# Patient Record
Sex: Male | Born: 1965 | Hispanic: No | Marital: Married | Smoking: Never smoker
Health system: Southern US, Community
[De-identification: ages and names within clinical notes are randomized; demographics above are authoritative.]

## PROBLEM LIST (undated history)

## (undated) DIAGNOSIS — K750 Abscess of liver: Secondary | ICD-10-CM

## (undated) DIAGNOSIS — I10 Essential (primary) hypertension: Secondary | ICD-10-CM

## (undated) DIAGNOSIS — K5790 Diverticulosis of intestine, part unspecified, without perforation or abscess without bleeding: Secondary | ICD-10-CM

## (undated) DIAGNOSIS — E785 Hyperlipidemia, unspecified: Secondary | ICD-10-CM

## (undated) HISTORY — DX: Essential (primary) hypertension: I10

## (undated) HISTORY — PX: ABSCESS DRAIN LIVER PERC (ARMC HX): HXRAD1235

## (undated) HISTORY — DX: Hyperlipidemia, unspecified: E78.5

## (undated) HISTORY — DX: Diverticulosis of intestine, part unspecified, without perforation or abscess without bleeding: K57.90

---

## 2008-11-05 ENCOUNTER — Ambulatory Visit: Payer: Self-pay | Admitting: Internal Medicine

## 2008-11-06 ENCOUNTER — Inpatient Hospital Stay (HOSPITAL_COMMUNITY): Admission: EM | Admit: 2008-11-06 | Discharge: 2008-11-15 | Payer: Self-pay | Admitting: Emergency Medicine

## 2008-11-06 ENCOUNTER — Ambulatory Visit: Payer: Self-pay | Admitting: Infectious Disease

## 2008-11-19 ENCOUNTER — Telehealth: Payer: Self-pay | Admitting: Infectious Disease

## 2008-11-19 ENCOUNTER — Ambulatory Visit: Payer: Self-pay | Admitting: Infectious Disease

## 2008-11-19 DIAGNOSIS — K750 Abscess of liver: Secondary | ICD-10-CM | POA: Insufficient documentation

## 2008-11-19 LAB — CONVERTED CEMR LAB
Eosinophils Absolute: 0.1 10*3/uL (ref 0.0–0.7)
Hemoglobin: 11.9 g/dL — ABNORMAL LOW (ref 13.0–17.0)
Lymphocytes Relative: 15 % (ref 12–46)
Lymphs Abs: 1.5 10*3/uL (ref 0.7–4.0)
MCHC: 31.1 g/dL (ref 30.0–36.0)
Monocytes Absolute: 0.8 10*3/uL (ref 0.1–1.0)
Neutro Abs: 7.7 10*3/uL (ref 1.7–7.7)
Platelets: 635 10*3/uL — ABNORMAL HIGH (ref 150–400)
WBC: 10.1 10*3/uL (ref 4.0–10.5)

## 2008-11-26 ENCOUNTER — Ambulatory Visit (HOSPITAL_COMMUNITY): Admission: RE | Admit: 2008-11-26 | Discharge: 2008-11-26 | Payer: Self-pay | Admitting: Internal Medicine

## 2008-11-26 ENCOUNTER — Telehealth (INDEPENDENT_AMBULATORY_CARE_PROVIDER_SITE_OTHER): Payer: Self-pay | Admitting: Licensed Clinical Social Worker

## 2008-12-06 ENCOUNTER — Inpatient Hospital Stay (HOSPITAL_COMMUNITY): Admission: AD | Admit: 2008-12-06 | Discharge: 2008-12-07 | Payer: Self-pay | Admitting: Internal Medicine

## 2008-12-06 ENCOUNTER — Ambulatory Visit: Payer: Self-pay | Admitting: Infectious Disease

## 2008-12-06 ENCOUNTER — Ambulatory Visit: Payer: Self-pay | Admitting: Surgery

## 2008-12-06 ENCOUNTER — Encounter (INDEPENDENT_AMBULATORY_CARE_PROVIDER_SITE_OTHER): Payer: Self-pay | Admitting: Internal Medicine

## 2008-12-06 ENCOUNTER — Ambulatory Visit: Payer: Self-pay | Admitting: Cardiovascular Disease

## 2008-12-06 DIAGNOSIS — R110 Nausea: Secondary | ICD-10-CM

## 2008-12-06 DIAGNOSIS — R091 Pleurisy: Secondary | ICD-10-CM | POA: Insufficient documentation

## 2008-12-06 DIAGNOSIS — R1013 Epigastric pain: Secondary | ICD-10-CM

## 2008-12-06 DIAGNOSIS — K3189 Other diseases of stomach and duodenum: Secondary | ICD-10-CM

## 2008-12-06 LAB — CONVERTED CEMR LAB
ALT: 19 units/L (ref 0–53)
Albumin: 3.7 g/dL (ref 3.5–5.2)
BUN: 13 mg/dL (ref 6–23)
Calcium: 8.9 mg/dL (ref 8.4–10.5)
Glucose, Bld: 90 mg/dL (ref 70–99)
Hemoglobin: 13.4 g/dL (ref 13.0–17.0)
MCV: 88 fL (ref 78.0–100.0)
Neutrophils Relative %: 65 % (ref 43–77)
Platelets: 283 10*3/uL (ref 150–400)
RDW: 15.2 % (ref 11.5–15.5)
Total Bilirubin: 0.5 mg/dL (ref 0.3–1.2)
Total Protein: 7 g/dL (ref 6.0–8.3)
WBC: 5.5 10*3/uL (ref 4.0–10.5)

## 2008-12-07 ENCOUNTER — Encounter (INDEPENDENT_AMBULATORY_CARE_PROVIDER_SITE_OTHER): Payer: Self-pay | Admitting: Internal Medicine

## 2008-12-07 ENCOUNTER — Encounter: Payer: Self-pay | Admitting: Cardiovascular Disease

## 2008-12-22 DIAGNOSIS — R079 Chest pain, unspecified: Secondary | ICD-10-CM

## 2008-12-26 ENCOUNTER — Ambulatory Visit: Payer: Self-pay

## 2008-12-26 ENCOUNTER — Ambulatory Visit: Payer: Self-pay | Admitting: Cardiology

## 2008-12-26 ENCOUNTER — Encounter: Payer: Self-pay | Admitting: Cardiovascular Disease

## 2009-01-18 ENCOUNTER — Encounter: Payer: Self-pay | Admitting: Infectious Disease

## 2009-01-31 ENCOUNTER — Telehealth: Payer: Self-pay | Admitting: Infectious Disease

## 2009-10-12 ENCOUNTER — Telehealth: Payer: Self-pay | Admitting: Internal Medicine

## 2009-10-13 ENCOUNTER — Inpatient Hospital Stay (HOSPITAL_COMMUNITY): Admission: EM | Admit: 2009-10-13 | Discharge: 2009-10-17 | Payer: Self-pay | Admitting: Emergency Medicine

## 2009-10-14 ENCOUNTER — Encounter (INDEPENDENT_AMBULATORY_CARE_PROVIDER_SITE_OTHER): Payer: Self-pay | Admitting: *Deleted

## 2009-10-15 ENCOUNTER — Ambulatory Visit: Payer: Self-pay | Admitting: Gastroenterology

## 2009-10-22 ENCOUNTER — Telehealth: Payer: Self-pay | Admitting: Gastroenterology

## 2009-11-04 ENCOUNTER — Ambulatory Visit: Payer: Self-pay | Admitting: Gastroenterology

## 2009-11-04 DIAGNOSIS — R933 Abnormal findings on diagnostic imaging of other parts of digestive tract: Secondary | ICD-10-CM | POA: Insufficient documentation

## 2009-11-04 DIAGNOSIS — E739 Lactose intolerance, unspecified: Secondary | ICD-10-CM

## 2009-11-07 ENCOUNTER — Ambulatory Visit: Payer: Self-pay | Admitting: Internal Medicine

## 2009-11-20 ENCOUNTER — Ambulatory Visit: Payer: Self-pay | Admitting: Gastroenterology

## 2009-11-25 ENCOUNTER — Encounter: Payer: Self-pay | Admitting: Gastroenterology

## 2010-06-23 ENCOUNTER — Encounter: Payer: Self-pay | Admitting: Internal Medicine

## 2010-06-23 ENCOUNTER — Encounter: Payer: Self-pay | Admitting: Infectious Disease

## 2010-07-01 NOTE — Procedures (Signed)
Summary: Colonoscopy  Patient: [name] [name] Note: All result statuses are Final unless otherwise noted.  Tests: (1) Colonoscopy (COL)   COL Colonoscopy           [facility]ONE      [facility]     [address]     [place], [place]  [address]           COLONOSCOPY PROCEDURE REPORT           PATIENT:  [name] [name], [name] [name]  MR#:  [id number]     BIRTHDATE:  Oct 04, 1965, 44 yrs. old  GENDER:  male     ENDOSCOPIST:  Judie Petit T. Russella Dar, MD, [facility]     Referred by:  Avel Peace, M.D.     PROCEDURE DATE:  11/20/2009     PROCEDURE:  Colonoscopy with hot biopsy and snare polypectomy     ASA CLASS:  Class I     INDICATIONS:  1) abnormal CT of abdomen     MEDICATIONS:   Fentanyl 125 mcg IV, Versed 12 mg IV     DESCRIPTION OF PROCEDURE:   After the risks benefits and     alternatives of the procedure were thoroughly explained, informed     consent was obtained.  Digital rectal exam was performed and     revealed no abnormalities.   The LB PCF-H180AL [id number] endoscope     was introduced through the anus and advanced to the terminal ileum     which was intubated for a short distance, without limitations.     The quality of the prep was excellent, using MoviPrep.  The     instrument was then slowly withdrawn as the colon was fully     examined.     <<PROCEDUREIMAGES>>     FINDINGS:  Two polyps were found in the descending colon. They     were 4 - 5 mm in size. Polyps were snared without cautery.     Retrieval was successful. Two polyps were found in the sigmoid     colon. They were 3 - 4 mm in size. With hot biopsy forceps, the     polps were cauterized, biopseis were obtained and sent to     pathology.  The terminal ileum appeared normal.  A normal     appearing cecum, ileocecal valve, and appendiceal orifice were     identified. The ascending, hepatic flexure, transverse, splenic     flexure, and rectum appeared unremarkable. Retroflexed views in     the rectum revealed no abnormalities. The  time to cecum = 3.75     minutes. The scope was then withdrawn (time = 16.5 min) from the     patient and the procedure completed.           COMPLICATIONS:  None           ENDOSCOPIC IMPRESSION:     1) 4 - 5 mm, Two polyps in the descending colon     2) 3 - 4 mm, Two polyps in the sigmoid colon     3) Normal terminal ileum           RECOMMENDATIONS:     1) No aspirin or NSAID's for 2 weeks     2) Await pathology results     3) If the polyps removed today are adenomatous (pre-cancerous),     you will need a repeat colonoscopy in 5 years. Otherwise you     should continue to follow colorectal cancer screening  guidelines     for "routine risk" patients with colonoscopy in 10 years.4) Follow     up with Dr. Abbey Chatters as planned           Venita Lick. Russella Dar, MD, [facility]           n.     eSIGNED:   Venita Lick. Daymeon Fischman at 11/20/2009 03:30 PM           Tonye Becket, [id number]  Note: An exclamation mark (!) indicates a result that was not dispersed into the flowsheet. Document Creation Date: 11/20/2009 3:30 PM _______________________________________________________________________  (1) Order result status: Final Collection or observation date-time: 11/20/2009 15:24 Requested date-time:  Receipt date-time:  Reported date-time:  Referring Physician:   Ordering Physician: Claudette Head [id number]) Specimen Source:  Source: Launa Grill Order Number: [id number] Lab site:   Appended Document: Colonoscopy     Procedures Next Due Date:    Colonoscopy: 10/2014

## 2010-07-01 NOTE — Progress Notes (Signed)
Summary: Schedule office visit  ---- Converted from flag ---- ---- 10/18/2009 11:50 AM, Meryl Dare MD [facility] wrote: Yes  ---- 10/18/2009 9:49 AM, Christie Nottingham CMA (AAMA) wrote: Can I double book you? You do not have any slots available.   ---- 10/17/2009 2:48 PM, Meryl Dare MD [facility] wrote: Needs office appt with me in 2-3 weeks for consider scheduling colonoscopy. Thx. MS ------------------------------  Phone Note Outgoing Call Call back at [facility] Phone [phone]   Call placed by: Christie Nottingham CMA Duncan Dull),  Oct 22, 2009 9:50 AM Call placed to: Patient Summary of Call: Called and left a message for pt on 10/18/09. Called and left another message for pt to call our office to schedule a office visit.  Initial call taken by: Christie Nottingham CMA Duncan Dull),  Oct 22, 2009 9:50 AM  Follow-up for Phone Call        Pt scheduled to see Dr. Russella Dar on 11/04/09 at 2:15pm. Pt's wife verbalized understanding of appt.  Follow-up by: Christie Nottingham CMA Duncan Dull),  Oct 24, 2009 9:07 AM

## 2010-07-01 NOTE — Progress Notes (Signed)
  Phone Note Call from Patient   Reason for Call: Acute Illness Complaint: Abdominal Pain Action Taken: Patient advised to go to ER Summary of Call: Got the call from his wife, stating that he has fever with right lower abdominal pain for one day. She requested for direct admission and I refused that and explained that he needs to go to ER before admission.  Initial call taken by: Jackson Latino MD,  Oct 12, 2009 10:18 PM

## 2010-07-01 NOTE — Assessment & Plan Note (Signed)
Summary: hosp f/u/ discuss colonoscopy/all   History of Present Illness Visit Type: follow up Primary GI MD: Elie Goody MD [facility] Requesting Provider: Avel Peace, MD Chief Complaint: Patient is here to discuss having a colonscopy. Patinet is post hospital and states that he is doing good and denies any GI complaints.  History of Present Illness:   This office visit follows a hospital consultation at [facility] for presumed small bowel diverticulitis presenting with right lower quadrant pain and an abnormal CT of the ileum. The symptoms have completely resolved after a course of antibiotics. His recent hospital discharge summary and recent imaging reports were reviewed. He has no gastrointestinal complaints. Surgical resection is planned by Dr. Abbey Chatters.   GI Review of Systems      Denies abdominal pain, acid reflux, belching, bloating, chest pain, dysphagia with liquids, dysphagia with solids, heartburn, loss of appetite, nausea, vomiting, vomiting blood, weight loss, and  weight gain.        Denies anal fissure, black tarry stools, change in bowel habit, constipation, diarrhea, diverticulosis, fecal incontinence, heme positive stool, hemorrhoids, irritable bowel syndrome, jaundice, light color stool, liver problems, rectal bleeding, and  rectal pain.   Current Medications (verified): 1)  None  Allergies (verified): 1)  ! * Latex  Past History:  Past Medical History: LACTOSE INTOLERANCE (ICD-271.3) DYSPEPSIA, MILD (ICD-536.8) PLEURISY (ICD-511.0) ABSCESS OF LIVER (ICD-572.0)  Past Surgical History: drainage of liver abcess 10/2009  Family History: Family history is noncontributory.  No FH of Colon Cancer:  Social History: married, employed. He is currently not working. His wife is a weekend relief nurse in the [employer].  He does walk on a regular basis.  There is no alcohol or cigarette intake.  Occupation: Consulting civil engineer  Review of Systems       The pertinent positives and  negatives are noted as above and in the HPI. All other ROS were reviewed and were negative.   Vital Signs:  Patient profile:   45 year old male Height:      68 inches Weight:      170.2 pounds BMI:     25.97 Pulse rate:   80 / minute Pulse rhythm:   regular BP sitting:   112 / 70  (right arm) Cuff size:   regular  Vitals Entered By: Harlow Mares CMA Duncan Dull) (November 04, 2009 3:16 PM)  Physical Exam  General:  Well developed, well nourished, no acute distress. Head:  Normocephalic and atraumatic. Eyes:  PERRLA, no icterus. Ears:  Normal auditory acuity. Mouth:  No deformity or lesions, dentition normal. Neck:  Supple; no masses or thyromegaly. Lungs:  Clear throughout to auscultation. Heart:  Regular rate and rhythm; no murmurs, rubs,  or bruits. Abdomen:  Soft, nontender and nondistended. No masses, hepatosplenomegaly or hernias noted. Normal bowel sounds. Rectal:  deferred until time of colonoscopy.   Msk:  Symmetrical with no gross deformities. Normal posture. Pulses:  Normal pulses noted. Extremities:  No clubbing, cyanosis, edema or deformities noted. Neurologic:  Alert and  oriented x4;  grossly normal neurologically. Cervical Nodes:  No significant cervical adenopathy. Axillary Nodes:  No significant axillary adenopathy. Psych:  Alert and cooperative. Normal mood and affect.  Impression & Recommendations:  Problem # 1:  NONSPECIFIC ABN FINDING RAD & OTH EXAM GI TRACT (ICD-793.4) Presumed small bowel diverticulitis. His symptoms have all resolved. Further evaluation with a CT enterography, and colonoscopy with attempted intubation of the terminal ileum. The risks, benefits and alternatives to colonoscopy with possible  biopsy and possible polypectomy were discussed with the patient and they consent to proceed. The procedure will be scheduled electively. Orders: Colonoscopy (Colon)  Other Orders: GI Misc Procedure/ Radiology Order (GI Misc )  Patient Instructions: 1)   Pick up your prep from your pharmacy.  2)  Colonoscopy brochure given.  3)  You have been scheduled for a CT scan. 4)  Copy sent to : Avel Peace, MD 5)  The medication list was reviewed and reconciled.  All changed / newly prescribed medications were explained.  A complete medication list was provided to the patient / caregiver.  Prescriptions: MOVIPREP 100 GM  SOLR (PEG-KCL-NACL-NASULF-NA ASC-C) As per prep instructions.  #1 x 0   Entered by:   Christie Nottingham CMA (AAMA)   Authorized by:   Meryl Dare MD [facility]   Signed by:   Christie Nottingham CMA (AAMA) on 11/04/2009   Method used:   Electronically to        [facility] (retail)       [address]       [place], [place]  [address]       Ph: [phone] or [phone]       Fax: [phone]   RxID:   [id number]

## 2010-07-01 NOTE — Letter (Signed)
Summary: [facility] Instructions  [facility]  [address] [place], [place] [address]   Phone: [phone]  Fax: [phone]       EFOSA TREICHLER    1965-07-30    MRN: [id number]        Procedure Day Dorna Bloom: Wednesday June 22nd, 2011     Arrival Time: 2:00pm     Procedure Time: 3:00pm     Location of Procedure:                    _x _  [facility] (4th Floor)                        PREPARATION FOR COLONOSCOPY WITH MOVIPREP   Starting 5 days prior to your procedure 11/15/09 do not eat nuts, seeds, popcorn, corn, beans, peas,  salads, or any raw vegetables.  Do not take any fiber supplements (e.g. Metamucil, Citrucel, and Benefiber).  THE DAY BEFORE YOUR PROCEDURE         DATE: 11/19/09  DAY: Tuesday  1.  Drink clear liquids the entire day-NO SOLID FOOD  2.  Do not drink anything colored red or purple.  Avoid juices with pulp.  No orange juice.  3.  Drink at least 64 oz. (8 glasses) of fluid/clear liquids during the day to prevent dehydration and help the prep work efficiently.  CLEAR LIQUIDS INCLUDE: Water Jello Ice Popsicles Tea (sugar ok, no milk/cream) Powdered fruit flavored drinks Coffee (sugar ok, no milk/cream) Gatorade Juice: apple, white grape, white cranberry  Lemonade Clear bullion, consomm, broth Carbonated beverages (any kind) Strained chicken noodle soup Hard Candy                             4.  In the morning, mix first dose of MoviPrep solution:    Empty 1 Pouch A and 1 Pouch B into the disposable container    Add lukewarm drinking water to the top line of the container. Mix to dissolve    Refrigerate (mixed solution should be used within 24 hrs)  5.  Begin drinking the prep at 5:00 p.m. The MoviPrep container is divided by 4 marks.   Every 15 minutes drink the solution down to the next mark (approximately 8 oz) until the full liter is complete.   6.  Follow completed prep with 16 oz of clear liquid of your choice  (Nothing red or purple).  Continue to drink clear liquids until bedtime.  7.  Before going to bed, mix second dose of MoviPrep solution:    Empty 1 Pouch A and 1 Pouch B into the disposable container    Add lukewarm drinking water to the top line of the container. Mix to dissolve    Refrigerate  THE DAY OF YOUR PROCEDURE      DATE: 11/20/09 DAY: Wednesday  Beginning at 10:00 a.m. (5 hours before procedure):         1. Every 15 minutes, drink the solution down to the next mark (approx 8 oz) until the full liter is complete.  2. Follow completed prep with 16 oz. of clear liquid of your choice.    3. You may drink clear liquids until 1:00pm (2 HOURS BEFORE PROCEDURE).   MEDICATION INSTRUCTIONS  Unless otherwise instructed, you should take regular prescription medications with a small sip of water   as early as possible the morning of your  procedure.        OTHER INSTRUCTIONS  You will need a responsible adult at least 48 years of age to accompany you and drive you home.   This person must remain in the waiting room during your procedure.  Wear loose fitting clothing that is easily removed.  Leave jewelry and other valuables at home.  However, you may wish to bring a book to read or  an iPod/MP3 player to listen to music as you wait for your procedure to start.  Remove all body piercing jewelry and leave at home.  Total time from sign-in until discharge is approximately 2-3 hours.  You should go home directly after your procedure and rest.  You can resume normal activities the  day after your procedure.  The day of your procedure you should not:   Drive   Make legal decisions   Operate machinery   Drink alcohol   Return to work  You will receive specific instructions about eating, activities and medications before you leave.    The above instructions have been reviewed and explained to me by   Marchelle Folks.     I fully understand and can verbalize these  instructions _____________________________ Date _________

## 2010-07-01 NOTE — Letter (Signed)
Summary: Patient Notice- Polyp Results  [facility]  [address] [place], [place] [address]   Phone: [phone]  Fax: [phone]        November 25, 2009 MRN: [id number]    [facility] [address] [place], [place]  [address]    Dear Mr. Soderlund,  I am pleased to inform you that the colon polyp(s) removed during your recent colonoscopy was (were) found to be benign (no cancer detected) upon pathologic examination.  I recommend you have a repeat colonoscopy examination in 5 years to look for recurrent polyps, as having colon polyps increases your risk for having recurrent polyps or even colon cancer in the future.  Should you develop new or worsening symptoms of abdominal pain, bowel habit changes or bleeding from the rectum or bowels, please schedule an evaluation with either your primary care physician or with me.  Continue treatment plan as outlined the day of your exam.  Please call us if you are having persistent problems or have questions about your condition that have not been fully answered at this time.  Sincerely,  Meryl Dare MD [facility]  This letter has been electronically signed by your physician.  Appended Document: Patient Notice- Polyp Results letter mailed 7.6.11

## 2010-08-18 LAB — URINALYSIS, ROUTINE W REFLEX MICROSCOPIC
Glucose, UA: NEGATIVE mg/dL
Hgb urine dipstick: NEGATIVE
Ketones, ur: NEGATIVE mg/dL
Nitrite: NEGATIVE
Protein, ur: NEGATIVE mg/dL
Specific Gravity, Urine: 1.013 (ref 1.005–1.030)
Urobilinogen, UA: 0.2 mg/dL (ref 0.0–1.0)
pH: 6.5 (ref 5.0–8.0)

## 2010-08-18 LAB — BASIC METABOLIC PANEL
BUN: 3 mg/dL — ABNORMAL LOW (ref 6–23)
CO2: 24 mEq/L (ref 19–32)
CO2: 27 mEq/L (ref 19–32)
Calcium: 8.2 mg/dL — ABNORMAL LOW (ref 8.4–10.5)
Chloride: 111 mEq/L (ref 96–112)
Creatinine, Ser: 0.89 mg/dL (ref 0.4–1.5)
Creatinine, Ser: 1.01 mg/dL (ref 0.4–1.5)
GFR calc Af Amer: >60 mL/min (ref >60)
Glucose, Bld: 89 mg/dL (ref 70–99)
Sodium: 143 mEq/L (ref 135–145)

## 2010-08-18 LAB — CBC
Hemoglobin: 13.7 g/dL (ref 13.0–17.0)
MCHC: 34.2 g/dL (ref 30.0–36.0)
MCHC: 34.7 g/dL (ref 30.0–36.0)
MCV: 91.1 fL (ref 78.0–100.0)
Platelets: 241 10*3/uL (ref 150–400)
RBC: 4.39 MIL/uL (ref 4.22–5.81)
RDW: 12.8 % (ref 11.5–15.5)
RDW: 12.9 % (ref 11.5–15.5)

## 2010-08-18 LAB — PHOSPHORUS: Phosphorus: 2.7 mg/dL (ref 2.3–4.6)

## 2010-08-18 LAB — MAGNESIUM: Magnesium: 2.2 mg/dL (ref 1.5–2.5)

## 2010-08-19 LAB — COMPREHENSIVE METABOLIC PANEL
ALT: 26 U/L (ref 0–53)
AST: 27 U/L (ref 0–37)
Alkaline Phosphatase: 39 U/L (ref 39–117)
CO2: 26 mEq/L (ref 19–32)
Glucose, Bld: 117 mg/dL — ABNORMAL HIGH (ref 70–99)
Potassium: 3.6 mEq/L (ref 3.5–5.1)
Sodium: 138 mEq/L (ref 135–145)
Total Protein: 6.5 g/dL (ref 6.0–8.3)

## 2010-08-19 LAB — DIFFERENTIAL
Basophils Relative: 0 % (ref 0–1)
Eosinophils Absolute: 0 10*3/uL (ref 0.0–0.7)
Eosinophils Relative: 0 % (ref 0–5)
Monocytes Absolute: 0.6 10*3/uL (ref 0.1–1.0)
Monocytes Relative: 4 % (ref 3–12)
Neutrophils Relative %: 87 % — ABNORMAL HIGH (ref 43–77)

## 2010-08-19 LAB — CBC
Hemoglobin: 15.4 g/dL (ref 13.0–17.0)
RBC: 4.95 MIL/uL (ref 4.22–5.81)
RDW: 12.5 % (ref 11.5–15.5)

## 2010-09-07 LAB — COMPREHENSIVE METABOLIC PANEL
Alkaline Phosphatase: 46 U/L (ref 39–117)
BUN: 13 mg/dL (ref 6–23)
CO2: 26 mEq/L (ref 19–32)
Chloride: 107 mEq/L (ref 96–112)
Creatinine, Ser: 1.07 mg/dL (ref 0.4–1.5)
GFR calc non Af Amer: >60 mL/min (ref >60)
Glucose, Bld: 119 mg/dL — ABNORMAL HIGH (ref 70–99)
Potassium: 4.1 mEq/L (ref 3.5–5.1)
Total Bilirubin: 0.7 mg/dL (ref 0.3–1.2)

## 2010-09-07 LAB — CBC
HCT: 39.8 % (ref 39.0–52.0)
Hemoglobin: 13.8 g/dL (ref 13.0–17.0)
MCV: 88.6 fL (ref 78.0–100.0)
RBC: 4.49 MIL/uL (ref 4.22–5.81)
WBC: 5.2 10*3/uL (ref 4.0–10.5)

## 2010-09-07 LAB — CARDIAC PANEL(CRET KIN+CKTOT+MB+TROPI)
Relative Index: INVALID (ref 0.0–2.5)
Troponin I: 0.01 ng/mL (ref 0.00–0.06)
Troponin I: 0.01 ng/mL (ref 0.00–0.06)

## 2010-09-08 LAB — BLOOD GAS, ARTERIAL
Acid-base deficit: 10.3 mmol/L — ABNORMAL HIGH (ref 0.0–2.0)
Acid-base deficit: 9.1 mmol/L — ABNORMAL HIGH (ref 0.0–2.0)
Bicarbonate: 12.4 mEq/L — ABNORMAL LOW (ref 20.0–24.0)
Bicarbonate: 14.6 mEq/L — ABNORMAL LOW (ref 20.0–24.0)
Bicarbonate: 15.6 mEq/L — ABNORMAL LOW (ref 20.0–24.0)
Bicarbonate: 17.4 mEq/L — ABNORMAL LOW (ref 20.0–24.0)
Drawn by: 317771
FIO2: 0.4 %
MECHVT: 500 mL
O2 Content: 4 L/min
O2 Saturation: 98.7 %
O2 Saturation: 99.4 %
O2 Saturation: 99.7 %
PEEP: 5 cmH2O
Patient temperature: 96.7
Patient temperature: 98.6
RATE: 28 resp/min
TCO2: 15.5 mmol/L (ref 0–100)
TCO2: 16.6 mmol/L (ref 0–100)
TCO2: 18 mmol/L (ref 0–100)
pCO2 arterial: 29 mmHg — ABNORMAL LOW (ref 35.0–45.0)
pH, Arterial: 7.322 — ABNORMAL LOW (ref 7.350–7.450)
pH, Arterial: 7.39 (ref 7.350–7.450)
pH, Arterial: 7.394 (ref 7.350–7.450)
pH, Arterial: 7.46 — ABNORMAL HIGH (ref 7.350–7.450)
pO2, Arterial: 124 mmHg — ABNORMAL HIGH (ref 80.0–100.0)
pO2, Arterial: 207 mmHg — ABNORMAL HIGH (ref 80.0–100.0)
pO2, Arterial: 222 mmHg — ABNORMAL HIGH (ref 80.0–100.0)

## 2010-09-08 LAB — HEMOGLOBIN A1C
Hgb A1c MFr Bld: 5.3 % (ref 4.6–6.1)
Mean Plasma Glucose: 105 mg/dL

## 2010-09-08 LAB — DIFFERENTIAL
Basophils Absolute: 0 10*3/uL (ref 0.0–0.1)
Basophils Absolute: 0 10*3/uL (ref 0.0–0.1)
Basophils Absolute: 0 10*3/uL (ref 0.0–0.1)
Basophils Absolute: 0 10*3/uL (ref 0.0–0.1)
Basophils Absolute: 0.1 10*3/uL (ref 0.0–0.1)
Basophils Relative: 0 % (ref 0–1)
Basophils Relative: 0 % (ref 0–1)
Basophils Relative: 0 % (ref 0–1)
Basophils Relative: 0 % (ref 0–1)
Basophils Relative: 0 % (ref 0–1)
Basophils Relative: 1 % (ref 0–1)
Eosinophils Absolute: 0.2 10*3/uL (ref 0.0–0.7)
Eosinophils Absolute: 0 10*3/uL (ref 0.0–0.7)
Eosinophils Absolute: 0 10*3/uL (ref 0.0–0.7)
Eosinophils Absolute: 0.1 10*3/uL (ref 0.0–0.7)
Eosinophils Absolute: 0.1 10*3/uL (ref 0.0–0.7)
Eosinophils Relative: 0 % (ref 0–5)
Eosinophils Relative: 0 % (ref 0–5)
Eosinophils Relative: 0 % (ref 0–5)
Lymphocytes Relative: 4 % — ABNORMAL LOW (ref 12–46)
Lymphocytes Relative: 7 % — ABNORMAL LOW (ref 12–46)
Lymphocytes Relative: 7 % — ABNORMAL LOW (ref 12–46)
Lymphs Abs: 0.6 10*3/uL — ABNORMAL LOW (ref 0.7–4.0)
Lymphs Abs: 0.9 10*3/uL (ref 0.7–4.0)
Lymphs Abs: 0.9 10*3/uL (ref 0.7–4.0)
Monocytes Absolute: 0.7 10*3/uL (ref 0.1–1.0)
Monocytes Absolute: 0.5 10*3/uL (ref 0.1–1.0)
Monocytes Absolute: 0.8 10*3/uL (ref 0.1–1.0)
Monocytes Relative: 6 % (ref 3–12)
Monocytes Relative: 3 % (ref 3–12)
Monocytes Relative: 4 % (ref 3–12)
Monocytes Relative: 6 % (ref 3–12)
Monocytes Relative: 8 % (ref 3–12)
Neutro Abs: 9.6 10*3/uL — ABNORMAL HIGH (ref 1.7–7.7)
Neutro Abs: 11 10*3/uL — ABNORMAL HIGH (ref 1.7–7.7)
Neutro Abs: 11.6 10*3/uL — ABNORMAL HIGH (ref 1.7–7.7)
Neutro Abs: 14.8 10*3/uL — ABNORMAL HIGH (ref 1.7–7.7)
Neutrophils Relative %: 81 % — ABNORMAL HIGH (ref 43–77)
Neutrophils Relative %: 83 % — ABNORMAL HIGH (ref 43–77)
Neutrophils Relative %: 86 % — ABNORMAL HIGH (ref 43–77)
Neutrophils Relative %: 87 % — ABNORMAL HIGH (ref 43–77)
Neutrophils Relative %: 88 % — ABNORMAL HIGH (ref 43–77)
Neutrophils Relative %: 91 % — ABNORMAL HIGH (ref 43–77)
Neutrophils Relative %: 93 % — ABNORMAL HIGH (ref 43–77)
WBC Morphology: INCREASED

## 2010-09-08 LAB — TYPE AND SCREEN
ABO/RH(D): O POS
Antibody Screen: NEGATIVE

## 2010-09-08 LAB — BASIC METABOLIC PANEL
BUN: 19 mg/dL (ref 6–23)
BUN: 5 mg/dL — ABNORMAL LOW (ref 6–23)
BUN: 7 mg/dL (ref 6–23)
CO2: 26 mEq/L (ref 19–32)
CO2: 20 mEq/L (ref 19–32)
Calcium: 8.3 mg/dL — ABNORMAL LOW (ref 8.4–10.5)
Calcium: 8.4 mg/dL (ref 8.4–10.5)
Calcium: 6.4 mg/dL — CL (ref 8.4–10.5)
Calcium: 7 mg/dL — ABNORMAL LOW (ref 8.4–10.5)
Calcium: 7.8 mg/dL — ABNORMAL LOW (ref 8.4–10.5)
Calcium: 7.8 mg/dL — ABNORMAL LOW (ref 8.4–10.5)
Calcium: 8.2 mg/dL — ABNORMAL LOW (ref 8.4–10.5)
Calcium: 8.3 mg/dL — ABNORMAL LOW (ref 8.4–10.5)
Chloride: 109 mEq/L (ref 96–112)
Chloride: 105 mEq/L (ref 96–112)
Creatinine, Ser: 0.94 mg/dL (ref 0.4–1.5)
Creatinine, Ser: 0.96 mg/dL (ref 0.4–1.5)
Creatinine, Ser: 0.88 mg/dL (ref 0.4–1.5)
Creatinine, Ser: 0.95 mg/dL (ref 0.4–1.5)
GFR calc Af Amer: >60 mL/min (ref >60)
GFR calc Af Amer: >60 mL/min (ref >60)
GFR calc Af Amer: >60 mL/min (ref >60)
GFR calc Af Amer: >60 mL/min (ref >60)
GFR calc non Af Amer: >60 mL/min (ref >60)
GFR calc non Af Amer: >60 mL/min (ref >60)
GFR calc non Af Amer: >60 mL/min (ref >60)
GFR calc non Af Amer: >60 mL/min (ref >60)
GFR calc non Af Amer: >60 mL/min (ref >60)
GFR calc non Af Amer: >60 mL/min (ref >60)
Glucose, Bld: 105 mg/dL — ABNORMAL HIGH (ref 70–99)
Glucose, Bld: 125 mg/dL — ABNORMAL HIGH (ref 70–99)
Glucose, Bld: 102 mg/dL — ABNORMAL HIGH (ref 70–99)
Glucose, Bld: 103 mg/dL — ABNORMAL HIGH (ref 70–99)
Glucose, Bld: 95 mg/dL (ref 70–99)
Potassium: 4.1 mEq/L (ref 3.5–5.1)
Potassium: 3.5 mEq/L (ref 3.5–5.1)
Potassium: 3.6 mEq/L (ref 3.5–5.1)
Potassium: 3.7 mEq/L (ref 3.5–5.1)
Potassium: 4.1 mEq/L (ref 3.5–5.1)
Sodium: 135 mEq/L (ref 135–145)
Sodium: 137 mEq/L (ref 135–145)
Sodium: 138 mEq/L (ref 135–145)
Sodium: 138 mEq/L (ref 135–145)
Sodium: 139 mEq/L (ref 135–145)

## 2010-09-08 LAB — APTT: aPTT: 42 seconds — ABNORMAL HIGH (ref 24–37)

## 2010-09-08 LAB — CBC
HCT: 28.1 % — ABNORMAL LOW (ref 39.0–52.0)
HCT: 31.1 % — ABNORMAL LOW (ref 39.0–52.0)
HCT: 33.7 % — ABNORMAL LOW (ref 39.0–52.0)
HCT: 33.8 % — ABNORMAL LOW (ref 39.0–52.0)
HCT: 34.5 % — ABNORMAL LOW (ref 39.0–52.0)
HCT: 35.6 % — ABNORMAL LOW (ref 39.0–52.0)
Hemoglobin: 11.7 g/dL — ABNORMAL LOW (ref 13.0–17.0)
Hemoglobin: 10.6 g/dL — ABNORMAL LOW (ref 13.0–17.0)
Hemoglobin: 10.9 g/dL — ABNORMAL LOW (ref 13.0–17.0)
Hemoglobin: 11.6 g/dL — ABNORMAL LOW (ref 13.0–17.0)
Hemoglobin: 12 g/dL — ABNORMAL LOW (ref 13.0–17.0)
Hemoglobin: 12.3 g/dL — ABNORMAL LOW (ref 13.0–17.0)
Hemoglobin: 12.8 g/dL — ABNORMAL LOW (ref 13.0–17.0)
MCHC: 33.7 g/dL (ref 30.0–36.0)
MCHC: 33.9 g/dL (ref 30.0–36.0)
MCHC: 34.1 g/dL (ref 30.0–36.0)
MCHC: 34.1 g/dL (ref 30.0–36.0)
MCHC: 34.7 g/dL (ref 30.0–36.0)
MCV: 88.8 fL (ref 78.0–100.0)
MCV: 87.1 fL (ref 78.0–100.0)
MCV: 88 fL (ref 78.0–100.0)
MCV: 88.2 fL (ref 78.0–100.0)
MCV: 88.3 fL (ref 78.0–100.0)
Platelets: 619 10*3/uL — ABNORMAL HIGH (ref 150–400)
Platelets: 389 10*3/uL (ref 150–400)
Platelets: 459 10*3/uL — ABNORMAL HIGH (ref 150–400)
Platelets: 469 10*3/uL — ABNORMAL HIGH (ref 150–400)
Platelets: 556 10*3/uL — ABNORMAL HIGH (ref 150–400)
RBC: 3.9 MIL/uL — ABNORMAL LOW (ref 4.22–5.81)
RBC: 3.12 MIL/uL — ABNORMAL LOW (ref 4.22–5.81)
RBC: 3.19 MIL/uL — ABNORMAL LOW (ref 4.22–5.81)
RBC: 3.68 MIL/uL — ABNORMAL LOW (ref 4.22–5.81)
RBC: 3.88 MIL/uL — ABNORMAL LOW (ref 4.22–5.81)
RBC: 3.92 MIL/uL — ABNORMAL LOW (ref 4.22–5.81)
RBC: 4.06 MIL/uL — ABNORMAL LOW (ref 4.22–5.81)
RBC: 4.32 MIL/uL (ref 4.22–5.81)
RDW: 13.8 % (ref 11.5–15.5)
RDW: 13.9 % (ref 11.5–15.5)
RDW: 12.8 % (ref 11.5–15.5)
RDW: 13.1 % (ref 11.5–15.5)
RDW: 13.2 % (ref 11.5–15.5)
RDW: 13.3 % (ref 11.5–15.5)
RDW: 13.4 % (ref 11.5–15.5)
RDW: 13.5 % (ref 11.5–15.5)
RDW: 13.6 % (ref 11.5–15.5)
WBC: 14 10*3/uL — ABNORMAL HIGH (ref 4.0–10.5)
WBC: 12.8 10*3/uL — ABNORMAL HIGH (ref 4.0–10.5)
WBC: 13.2 10*3/uL — ABNORMAL HIGH (ref 4.0–10.5)
WBC: 13.4 10*3/uL — ABNORMAL HIGH (ref 4.0–10.5)
WBC: 15.9 10*3/uL — ABNORMAL HIGH (ref 4.0–10.5)
WBC: 17.4 10*3/uL — ABNORMAL HIGH (ref 4.0–10.5)

## 2010-09-08 LAB — BODY FLUID CULTURE
Culture: NO GROWTH
Gram Stain: NONE SEEN

## 2010-09-08 LAB — GLUCOSE, CAPILLARY
Glucose-Capillary: 127 mg/dL — ABNORMAL HIGH (ref 70–99)
Glucose-Capillary: 133 mg/dL — ABNORMAL HIGH (ref 70–99)
Glucose-Capillary: 88 mg/dL (ref 70–99)
Glucose-Capillary: 98 mg/dL (ref 70–99)
Glucose-Capillary: 99 mg/dL (ref 70–99)

## 2010-09-08 LAB — CARBOXYHEMOGLOBIN
Carboxyhemoglobin: 0.6 % (ref 0.5–1.5)
Carboxyhemoglobin: 0.7 % (ref 0.5–1.5)
Methemoglobin: 0.3 % (ref 0.0–1.5)
Methemoglobin: 0.7 % (ref 0.0–1.5)
O2 Saturation: 69.9 %
O2 Saturation: 91.6 %

## 2010-09-08 LAB — PHOSPHORUS
Phosphorus: 1.2 mg/dL — ABNORMAL LOW (ref 2.3–4.6)
Phosphorus: 3.1 mg/dL (ref 2.3–4.6)

## 2010-09-08 LAB — CULTURE, BLOOD (ROUTINE X 2)
Culture: NO GROWTH
Culture: NO GROWTH
Culture: NO GROWTH
Culture: NO GROWTH

## 2010-09-08 LAB — LACTATE DEHYDROGENASE, PLEURAL OR PERITONEAL FLUID: LD, Fluid: 1173 U/L — ABNORMAL HIGH (ref 3–23)

## 2010-09-08 LAB — COMPREHENSIVE METABOLIC PANEL
ALT: 60 U/L — ABNORMAL HIGH (ref 0–53)
ALT: 64 U/L — ABNORMAL HIGH (ref 0–53)
AST: 34 U/L (ref 0–37)
AST: 56 U/L — ABNORMAL HIGH (ref 0–37)
Albumin: 2.4 g/dL — ABNORMAL LOW (ref 3.5–5.2)
Alkaline Phosphatase: 125 U/L — ABNORMAL HIGH (ref 39–117)
Alkaline Phosphatase: 127 U/L — ABNORMAL HIGH (ref 39–117)
BUN: 13 mg/dL (ref 6–23)
BUN: 7 mg/dL (ref 6–23)
CO2: 17 mEq/L — ABNORMAL LOW (ref 19–32)
CO2: 24 mEq/L (ref 19–32)
CO2: 24 mEq/L (ref 19–32)
Calcium: 6.5 mg/dL — ABNORMAL LOW (ref 8.4–10.5)
Calcium: 7.8 mg/dL — ABNORMAL LOW (ref 8.4–10.5)
Chloride: 105 mEq/L (ref 96–112)
Chloride: 111 mEq/L (ref 96–112)
Creatinine, Ser: 0.79 mg/dL (ref 0.4–1.5)
Creatinine, Ser: 0.87 mg/dL (ref 0.4–1.5)
GFR calc Af Amer: >60 mL/min (ref >60)
GFR calc Af Amer: >60 mL/min (ref >60)
GFR calc non Af Amer: >60 mL/min (ref >60)
GFR calc non Af Amer: >60 mL/min (ref >60)
Glucose, Bld: 121 mg/dL — ABNORMAL HIGH (ref 70–99)
Glucose, Bld: 91 mg/dL (ref 70–99)
Glucose, Bld: 97 mg/dL (ref 70–99)
Potassium: 3.8 mEq/L (ref 3.5–5.1)
Potassium: 4.1 mEq/L (ref 3.5–5.1)
Sodium: 138 mEq/L (ref 135–145)
Sodium: 140 mEq/L (ref 135–145)
Sodium: 141 mEq/L (ref 135–145)
Total Bilirubin: 0.7 mg/dL (ref 0.3–1.2)
Total Bilirubin: 1 mg/dL (ref 0.3–1.2)
Total Protein: 4.6 g/dL — ABNORMAL LOW (ref 6.0–8.3)
Total Protein: 6.3 g/dL (ref 6.0–8.3)
Total Protein: 6.8 g/dL (ref 6.0–8.3)

## 2010-09-08 LAB — CLOSTRIDIUM DIFFICILE EIA: C difficile Toxins A+B, EIA: NEGATIVE

## 2010-09-08 LAB — HIGH SENSITIVITY CRP: CRP, High Sensitivity: 266.8 mg/L — ABNORMAL HIGH

## 2010-09-08 LAB — CALCIUM, IONIZED: Calcium, Ion: 1.03 mmol/L — ABNORMAL LOW (ref 1.12–1.32)

## 2010-09-08 LAB — CARDIAC PANEL(CRET KIN+CKTOT+MB+TROPI)
CK, MB: 2 ng/mL (ref 0.3–4.0)
CK, MB: 3 ng/mL (ref 0.3–4.0)
Relative Index: 0.9 (ref 0.0–2.5)
Total CK: 174 U/L (ref 7–232)
Total CK: 320 U/L — ABNORMAL HIGH (ref 7–232)
Troponin I: 0.06 ng/mL (ref 0.00–0.06)

## 2010-09-08 LAB — CULTURE, ROUTINE-ABSCESS

## 2010-09-08 LAB — MAGNESIUM: Magnesium: 2.2 mg/dL (ref 1.5–2.5)

## 2010-09-08 LAB — URINALYSIS, ROUTINE W REFLEX MICROSCOPIC
Bilirubin Urine: NEGATIVE
Glucose, UA: NEGATIVE mg/dL
Ketones, ur: 15 mg/dL — AB
Nitrite: NEGATIVE
Protein, ur: NEGATIVE mg/dL
pH: 5.5 (ref 5.0–8.0)

## 2010-09-08 LAB — GIARDIA/CRYPTOSPORIDIUM SCREEN(EIA): Giardia Screen - EIA: NEGATIVE

## 2010-09-08 LAB — LACTIC ACID, PLASMA: Lactic Acid, Venous: 1.1 mmol/L (ref 0.5–2.2)

## 2010-09-08 LAB — GLUCOSE, SEROUS FLUID: Glucose, Fluid: 115 mg/dL

## 2010-09-08 LAB — LIPID PANEL: Triglycerides: 58 mg/dL (ref <150)

## 2010-09-08 LAB — TSH: TSH: 0.482 u[IU]/mL (ref 0.350–4.500)

## 2010-09-08 LAB — BRAIN NATRIURETIC PEPTIDE: Pro B Natriuretic peptide (BNP): 147 pg/mL — ABNORMAL HIGH (ref 0.0–100.0)

## 2010-09-08 LAB — RAPID STREP SCREEN (MED CTR MEBANE ONLY): Streptococcus, Group A Screen (Direct): NEGATIVE

## 2010-09-08 LAB — CORTISOL: Cortisol, Plasma: 27.2 ug/dL

## 2010-10-14 NOTE — Discharge Summary (Signed)
NAMECLOIS, TREANOR             ACCOUNT NO.:  [id number]   MEDICAL RECORD NO.:  [id number]          PATIENT TYPE:  INP   LOCATION:  4736                         FACILITY:  [facility]   PHYSICIAN:  Corinna L. Lendell Caprice, MDDATE OF BIRTH:  09-06-1965   DATE OF ADMISSION:  12/06/2008  DATE OF DISCHARGE:  12/07/2008                               DISCHARGE SUMMARY   DISCHARGE DIAGNOSES:  1. Chest pain, myocardial infarction, and pulmonary embolus ruled out.  2. Liver abscess.  3. Nausea secondary to medications.   DISCHARGE MEDICATIONS:  1. Phenergan 25 mg p.o. q.6 h. p.r.n. nausea.  2. Continue Cipro 500 mg p.o. b.i.d.  3. Flagyl 500 mg p.o. t.i.d.  4. Pepcid 20 mg p.o. b.i.d.   ACTIVITY:  Ad lib.   DIET:  As tolerated.   CONDITION:  Stable.   CONSULTATIONS:  Peter C. Eden Emms, MD, [facility]   PROCEDURES:  None.   FOLLOWUP:  Follow up with [facility] as they scheduled for  outpatient stress test.   LABORATORIES:  CBC, complete metabolic panel, cardiac enzymes, BNP all  within normal limits.  CT angiogram of the chest was suboptimal contrast  bolus, but without evidence for significant proximal pulmonary embolus,  mildly degraded by patient's motion, improved aeration in the right,  right pleural fluid tracks into the major fissure, otherwise clear.  EKG  showed normal sinus rhythm with LVH and Qs and inferior leads.  Doppler  of the legs showed no DVT.  Echocardiogram showed ejection fraction of  50-55%, normal wall thickness, and inferobasal hypokinesis.   HISTORY AND HOSPITAL COURSE:  Mr. Teuscher is a pleasant 45 year old  Filipino male, who was directly admitted from Dr. Clinton Gallant office with  concerns about the pulmonary embolus.  Apparently, he had had some  periodic shortness of breath, chest pressure, and ankle pain.  A D-dimer  was done in the office, which was 6, and the patient was directly  admitted to telemetry.  CAT scan and Dopplers negative for thrombus.  BNP  was normal.  The patient did describe some chest tightness for  several weeks, not necessarily related to exertion.  He did report some  initial orthopnea after discharge from the hospital during the last  hospitalization, but this has since resolved.  Cardiology was consulted.  Echocardiogram was done, and they have recommended outpatient workup.  The patient is stable at this time for discharge.      Corinna L. Lendell Caprice, MD  Electronically Signed     CLS/MEDQ  D:  12/07/2008  T:  12/08/2008  Job:  [id number]   cc:   Deatra James, M.D.  Acey Lav, MD

## 2010-10-14 NOTE — Discharge Summary (Signed)
NAMERONAL, [place] NO.:  [id number]   MEDICAL RECORD NO.:  [id number]          PATIENT TYPE:  INP   LOCATION:  5528                         FACILITY:  [facility]   PHYSICIAN:  Hollice Espy, M.D.DATE OF BIRTH:  February 13, 1966   DATE OF ADMISSION:  11/05/2008  DATE OF DISCHARGE:                               DISCHARGE SUMMARY   ATTENDING PHYSICIAN:  Hollice Espy, M.D.   PRIMARY CARE PHYSICIAN:  Deatra James, M.D. of [place].   HOSPITAL CONSULTATIONS:  Acey Lav, M.D. of infectious disease.   DISCHARGE DIAGNOSES:  1. Abscess of the liver status post drain placement.  2. SIRS (systemic inflammatory response syndrome) now resolved.  3. Diarrhea.  4. Respiratory failure on ventilator, now resolved.  5. Respiratory failure felt to be secondary to neurological process.  6. Delirium and agitation secondary to SIRS.   DISCHARGE MEDICATIONS:  1. Rocephin 1 gram daily IV through PICC line until the patient      follows up with infectious disease on June 28.  2. Flagyl 500 p.o. t.i.d. to be continued until the patient follows up      with infectious disease on June 28.  3. He previously was on some Cipro orally but this medication is being      discontinued.  He will not be discharged on any other medications.   DISPOSITION:  Improved.   ACTIVITY:  Slow to increase.  The patient will go home with home health  RN.   HOSPITAL COURSE:  The patient is a 45 year old Filipino male with no  past medical history who presented with 2-3 weeks of fevers, malaise and  chills.  He initially had some possible pyuria as an outpatient and was  started on ciprofloxacin and started having some explosive watery  diarrhea.  He was complaining of some abdominal pain and was evaluated  on right upper quadrant ultrasound which noted an abscess 7 x 6 x 4 x 7  cm in mass.  The patient was admitted on June 8 for and placed broad  spectrum antibiotics including imipenem, vanco and  Flagyl.  CT scan  showed a multiloculated liver abscess.  Initially there was concern  about C. difficile colitis and the patient was admitted.  Initially the  patient did well and eventually radiology placed a drain in the liver.  However, over the next couple of days the patient started to go  downhill, temperature went up to 105, heart rate went into the 180s and  respiratory rate 35 and he became severely agitated and went into acute  respiratory failure requiring intubation.  He was able to be extubated  same day and continued to have spiking fevers.  Infectious disease saw  the patient.  Antibiotics were followed.  Cultures were sent.  C.  difficile cultures were negative.  The patient continued to have good  output of liver abscess from his drainage.  Chest x-ray was done on June  11 noting bilateral lung subsegmental reactive edema.  The patient  tolerated the antibiotics well.  By June 12 the patient continued to  show slow signs of  improvement.  By June 16 the patient had very little  liver abscess drainage.  CT scan done on June 16 noted much improvement  but still persistent signs of some abscess although much smaller.  His  white count had come down to 11.9, however, on June 16 it increased to  14.  He was at this point relatively pain-free.  Interventional  radiology recommended a followup CT done in 2 weeks.  Infectious  disease, the rotating resident, evaluated the patient and planned for  the patient to have outpatient followup on June 28.  In the meantime IV  Rocephin has been set up for the patient to continue with home health  through a PICC line.  He will continue on p.o. Flagyl, both to be  continued until at least June 28 when the patient sees infectious  disease.  However, we are awaiting for Dr. Daiva Eves to sign off given  that the patient's white count is elevated although he has remained  afebrile.  He appears to be doing well.  He is relatively pain-free and   nausea-free.  The patient's activity will be as tolerated.  He will go  home on home health.  He is to follow up with his PCP, Dr. Wynelle Link, in about  2-4 weeks.      Hollice Espy, M.D.  Electronically Signed     SKK/MEDQ  D:  11/15/2008  T:  11/15/2008  Job:  [id number]

## 2010-10-14 NOTE — Consult Note (Signed)
NAMECARDIN, NITSCHKE NO.:  [id number]   MEDICAL RECORD NO.:  [id number]          PATIENT TYPE:  INP   LOCATION:  6526                         FACILITY:  [facility]   PHYSICIAN:  Acey Lav, MD  DATE OF BIRTH:  [birth year]   DATE OF CONSULTATION:  DATE OF DISCHARGE:                                 CONSULTATION   DISCUSSION:  For details, please see Dr. Ozella Almond written note.  Briefly, this is a 45 year old Filipino man who presents with between 2  and 3 weeks of fevers and malaise and chills.  He had additionally had  some vague shoulder pain which he attributed to him overusing his  shoulder.  He felt so unwell that he did not attend church over the last  couple of  Sundays.  He was seen by his primary care physician Friday at  which point in time a urinalysis was done which showed pyuria.  He was  started on ciprofloxacin.  In the interim over the weekend he continued  to have fevers and chills despite the ciprofloxacin and then began to  have explosive watery diarrhea.  He was evaluated with a right upper  quadrant ultrasound which disclosed an abscess on the 7th at 6.1 x 5.4 x  7.6 cm in mass.  He was admitted by [facility] on the 8th of this  month and placed on broad-spectrum antibiotics including imipenem,  vancomycin and Flagyl.  CT scan that was obtained had shown a  multiloculated liver abscess 6.4 x 5.4 x 5 cm.  We are asked to assist  in the management of this patient with hepatic abscess.  I examined the  patient with Dr. Gwenlyn Perking, and I agree with the findings in his note.   The patient's exam was pertinent for temperature maximum of 99.1, some  tenderness to palpation in the right upper quadrant without rebound.  Otherwise no significant abnormalities.   His laboratory data was significant for the CT scan which I have  described, for a peripheral leukocytosis on admission of 13,400,  absolute neutrophil count of 11,100.  The patient has gone  down now for  CT-guided placement of drain into his hepatic abscess which will be sent  for culture.   This patient has lived in the [place] for most of his recent life  and has not been back to the [place] since 2002.  He has only  traveled to [facility] in the interim.  He has never had a history  amoebic dysentery to his knowledge.  He lives in [place] with his  wife and son.  They do not have exotic pets.  They do not have  specifically any underlings that live with them or in proximity to them.  I think this patient most likely had a pyogenic liver abscess with  source likely being either his biliary system or potentially appendix or  somewhere else  in his gastrointestinal system.  I think amoebic liver  abscess is far less likely and Echinococcus is essentially out of the  picture given lack of epidemiological risks and lack of radiographic  appearance consistent with this.  We will simplify the patient to Zosyn  in the interim.  My resident was also concerned about the possibility of  C diff colitis as well given that he had explosive diarrhea that began  after he was started on ciprofloxacin.  I think it is reasonable to  check some C diff toxins, although my suspicion is not terribly high for  C diff at this point.   Thank you for this fascinating consultation.  We will follow along  closely.      Acey Lav, MD  Electronically Signed    CV/MEDQ  D:  11/06/2008  T:  11/06/2008  Job:  [id number]

## 2010-10-14 NOTE — Consult Note (Signed)
[name], [name]             ACCOUNT NO.:  [id number]   MEDICAL RECORD NO.:  [id number]          PATIENT TYPE:  INP   LOCATION:  4736                         FACILITY:  [facility]   PHYSICIAN:  Noralyn Pick. Eden Emms, MD, FACCDATE OF BIRTH:  09/05/1965   DATE OF CONSULTATION:  DATE OF DISCHARGE:  12/07/2008                                 CONSULTATION   A 45 year old patient we were asked to see for chest pain and shortness  of breath.   The patient was admitted to the hospital yesterday.  He had had a  prolonged hospitalization for liver abscess and sepsis syndrome with a  right pleural effusion.  After this prolonged hospitalization, he  complained to his primary care doctor about right calf pain.  There was  a concern for pulmonary embolus.   In talking to the patient about a week ago, he describes some atypical  chest pressure, it was nonexertional, it waxed and waned over the course  of entire day or two.  He had some exertional dyspnea with no pleuritic  component.  There was no sputum production or cough.  The patient  currently is asymptomatic with no shortness of breath or chest pressure.  He thought his right calf had a muscle strain or spasm, it actually  feels better today.   So far his workup has shown a negative venous duplex with no evidence of  DVT.  CT scan showed improved aeration of the lung at the right lung  base with no evidence of proximal pulmonary emboli.  Note should be made  that it was a poor contrast bolus and the distal pulmonary arteries were  not well seen.   The patient has not had a previous cardiac problem.  His enzymes are  negative here in the hospital, and he has no acute EKG changes with LVH  and by limb-lead criteria only.   His review of system is, otherwise, negative, in particular he has not  had a fever.  There has been no pleuritic component.  No hemoptysis.  No  sputum production.  No history of lower extremity swelling.  Just crampy  stiffness and pain in the right calf.  A 10-point review of system is,  otherwise, negative.   His past medical history is remarkable for:  1. Liver abscess with sepsis syndrome, recent hospitalization on November 06, 2008, with discharge on November 15, 2008.  2. History of diarrhea.  3. He was on a ventilator during his sepsis syndrome.   Family history is noncontributory.   The patient is originally [place] the [place]  He spends in the states  originally in [place] in 2001 and moved to [place] in 2006.  He  is currently not working.  His wife is a weekend relief nurse in the  [employer].  He has 1 daughter who is with him in the room today.   He primarily stays at home.  He does walk on a regular basis, but is  otherwise, not very active.  There is no alcohol or cigarette intake.   His current medications  in the hospital include:  1. Aspirin a day.  2. Cipro 500 mg q.8.  3. Lovenox.  4. Metronidazole 500 t.i.d.  5. Zofran.  6. Acetaminophen.   He has no known allergies.   PHYSICAL EXAMINATION:  GENERAL:  Remarkable for a [place] male in no  distress.  VITAL SIGNS:  He is afebrile, 97.4,;pulse is 60 and regular,  respirations 16, blood pressure 140/80, sats are 100% on room air.  HEENT:  Unremarkable.  NECK:  Carotids are normal without bruit.  No lymphadenopathy,  thyromegaly, JVP elevation.  LUNGS:  Decreased breath sounds at the right base but no crackles, no  pleuritic rub.  Diaphragmatic motion is good.  CARDIOVASCULAR:  S1 and S2, normal heart sounds.  PMI normal.  ABDOMEN:  Benign, bowel sounds positive.  No AAA.  No tenderness.  No  bruit.  No hepatosplenomegaly, no hepatojugular reflux or tenderness.  EXTREMITIES:  Distal pulses are intact.  No edema.  NEURO:  Nonfocal.  SKIN:  Warm and dry.  No muscle weakness.   EKG shows LVH.  CT scan was as described in HPI with no evidence of PE.   Lab work was remarkable for negative enzymes.  BNP less than 30.   Creatinine 1.07, potassium 4.1, and hematocrit of 39.8.   His echocardiogram was reviewed.  The inferior base was not well seen.  There may have been mild hypokinesis here; however, the overall EF was  50-55% with no definitive regional wall motion abnormality.  There was  no evidence of significant valvular heart disease.  No evidence of  pulmonary hypertension.   IMPRESSION:  1. Brief chest pressure and shortness of breath about a week ago.  The      patient admitted to the hospital primarily for right leg pain, rule      out pulmonary embolism.  His ultrasound and CT scan are negative      for pulmonary embolism.  He is currently asymptomatic.  I do not      think the patient needs further inpatient workup.  He will continue      his antibiotics in regards to his diarrhea and previous liver      abscess.  I am not quite sure why the patient would have had this      since he does not appear immunocompromised.  In regards to his      heart, I will see him in about 2 weeks and we can do an exercise      stress echo on him in regards to ruling out coronary artery      disease.  2. Liver abscess.  Continue antibiotics and outpatient followup with      ID.  3. Borderline hypertension.  We will see what his blood pressure runs      in the office, it appears to be mildly elevated here.  He would be      a reasonable candidate for low-dose ACE inhibitor.   I discussed these plans with the patient and his wife.  He is anxious  for discharge.  We will contact Dr. Lendell Caprice to help arrange this, and I  will see him in the office in 2 weeks for a stress echo.     Noralyn Pick. Eden Emms, MD, [facility]  Electronically Signed    PCN/MEDQ  D:  12/07/2008  T:  12/08/2008  Job:  [id number]

## 2010-10-14 NOTE — H&P (Signed)
[name] [name], [name] [name]             ACCOUNT NO.:  [id number]   MEDICAL RECORD NO.:  [id number]          PATIENT TYPE:  INP   LOCATION:  6526                         FACILITY:  [facility]   PHYSICIAN:  Michiel Cowboy, MDDATE OF BIRTH:  02-25-1966   DATE OF ADMISSION:  11/05/2008  DATE OF DISCHARGE:                              HISTORY & PHYSICAL   PRIMARY CARE PHYSICIAN:  Deatra James, M.D.   CHIEF COMPLAINT:  Fevers, chills, right upper quadrant pain.   The patient is a 45 year old gentleman with no significant past medical  history who presented with 3-week history of fevers, chills, anorexia  and right upper quadrant discomfort.  At first, he was seen by his  primary care Shaman Muscarella who empirically started him on Cipro but with no  improvement at which point he was sent for a right upper quadrant, given  right upper quadrant discomfort, which showed a 6.5-cm either mass or  abscess in his liver.  He was instructed to go to the emergency  department where he had a CT scan of his abdomen done that showed a 6-cm  complex intrahepatic abscess.  Appendix appears to be normal.  His  gallbladder did show some gallbladder polyps on ultrasound but otherwise  was apparently unremarkable.  At which point, the patient was arranged  for CT scan-guided abscess draining in the morning.  Surgical consult  was called with Dr. Lindie Spruce who felt the patient would be better served by  admitting to medicine.  At which point, [facility] was called.  The patient has been admitted for further evaluation.   REVIEW OF SYSTEMS:  Significant fevers, chills fro 3 weeks, right upper  quadrant pain, poor appetitie Otherwise unremarkable.   PAST MEDICAL HISTORY:  None.   ALLERGIES:  None.   MEDICATIONS:  The patient not taking any medication.  He was recently  started on Cipro.   SOCIAL HISTORY:  The patient does not smoke or drink.  Does not abuse  drugs.   PHYSICAL EXAMINATION:  VITALS:  Temperature  100.7, blood pressure  122/81, pulse 105, respirations 20, saturating 100% on room air.  The  patient appears to be in no acute distress.  HEENT:  Nontraumatic.  Moist mucous membranes.  LUNGS:  Clear to auscultation bilaterally.  HEART:  Regular rate and rhythm.  No murmurs, rubs or gallops noted.  LOWER EXTREMITIES:  Without clubbing, cyanosis or edema.  ABDOMEN:  Very slight right upper quadrant tenderness/discomfort but  otherwise unremarkable.  No rebound.  No guarding.  NEUROLOGIC:  The patient intact.  SKIN:  Clean, dry and intact.   LABORATORY DATA:  White blood cell count 13.4.  Sodium 138, potassium  4.1, creatinine 1.04.  Alkaline phosphatase 122, AST 53, ALT 64, albumin  2.6.  CT scan showing 6.5-cm liver abscess as above.  Chest x-ray  unremarkable.   ASSESSMENT AND PLAN:  This is a 45 year old gentleman with liver abscess  of unclear etiology.  Appendix appeared normal.  Gallbladder appeared  normal besides evidence of cholecystitis.   1. Liver abscess:  This needs to be drained.  Per emergency department  Interventional radiology is already aware but would make sure that      they know about the patient in a.m.  For now, make n.p.o.  Would      recommend infectious disease consult as there is no clear etiology      for an abscess.  Will cover aggressively with vancomycin, imipenem      and add Flagyl for possible anti-amoebic coverage.  Await blood      cultures and cultures from the abscess. Patient is currently      hemodynamicaly stable. non toxic appearing.  2. Prophylaxis:  Protonix plus SCDs.      Michiel Cowboy, MD  Electronically Signed     AVD/MEDQ  D:  11/06/2008  T:  11/06/2008  Job:  [id number]   cc:   Deatra James, M.D.

## 2010-10-14 NOTE — H&P (Signed)
[name] [name], [name] [name]             ACCOUNT NO.:  [id number]   MEDICAL RECORD NO.:  [id number]          PATIENT TYPE:  INP   LOCATION:  4735                         FACILITY:  [facility]   PHYSICIAN:  [name] [name], MDDATE OF BIRTH:  03/28/1966   DATE OF ADMISSION:  12/06/2008  DATE OF DISCHARGE:                              HISTORY & PHYSICAL   PRIMARY CARE PHYSICIAN:  [name] James, MD with [name] [name].   CHIEF COMPLAINT:  Shortness of breath and calf pain.   HISTORY OF PRESENT ILLNESS:  [name] [name] is a 45 year old Filipino male,  recently hospitalized in June 2010 for liver abscess of unclear  etiology, status post percutaneous drainage, had followup appointment  with Dr. Daiva [name] today in [facility].  The patient did  complain of some shortness of breath and ankle pain which at the time  prompted Dr. Daiva [name] to check a D-dimer.  The D-dimer resulted at  greater than 6.0 at which time, [name] [name] were called to  directly admit the patient to the hospital for evaluation of PE.  The  patient does report right calf pain and chest pain shortly after  discharge on June 17 that has now resolved, however, wife reports the  patient is quite dyspneic on exertion notably after walking upstairs.  The patient denies any recent fever, headache, dizziness, abdominal  pain, nausea, vomiting, diarrhea, or lower extremity edema.  The patient  did have PICC line removed today at [facility].   PAST MEDICAL HISTORY:  Liver abscess, status post percutaneous drain,  hospitalized June 6 through November 15, 2008.   MEDICATIONS:  1. Cipro 500 mg p.o. b.i.d. x10 days.  2. Flagyl 500 mg p.o. t.i.d. x10 days.  Both these medications started      today.  3. Pepcid 40 mg p.o. daily.   ALLERGIES:  No known drug allergies.   REVIEW OF SYSTEMS:  GENERAL:  Negative weakness and fatigue.  EARS,  NOSE, AND THROAT:  Negative sore throat or lesions.  NECK:  Negative  stiffness and swelling.  CARDIOVASCULAR:  Positive chest pain.  Positive  dyspnea on exertion.  Negative palpitations.  RESPIRATORY:  Negative  cough.  Positive dyspnea on exertion.  GI:  Negative abdominal pain,  nausea, vomiting, diarrhea, melena, or hematochezia.  GENITOURINARY:  Negative dysuria or hematuria.  MUSCULOSKELETAL:  Positive right calf  pain, now resolved.  NEUROLOGIC:  Negative headache or dizziness.  PSYCHOLOGIC:  Negative depression, anxiety.  SKIN:  Negative suspicious  rashes or lesions.   PHYSICAL EXAMINATION:  VITAL SIGNS:  Blood pressure 122/88, heart rate  94, respirations 18, temp 98.3, and O2 sat is 100% on room air.  GENERAL:  This is a well-nourished, well-developed white male in no  acute distress.  HEAD:  Normocephalic and atraumatic.  EYES:  Pupils are equal, round, and reactive to light.  Extraocular  movements are intact.  No scleral icterus or injection.  EARS, NOSE, AND THROAT:  Mucous membranes are moist.  No oropharyngeal  lesions.  NECK:  Supple without thyromegaly or lymphadenopathy.  CHEST:  Symmetrical movement.  Nontender  to palpation.  CARDIOVASCULAR:  S1 and S2.  Regular rate and rhythm.  No murmurs, rubs,  or gallops.  No lower extremity edema.  RESPIRATORY:  The patient with diminished left lower lobe lung sounds,  however, clear to auscultation bilaterally.  No wheezes, rales, or  crackles.  No increased work of breathing.  GI:  Abdomen is soft, nontender, and nondistended with positive bowel  sounds.  No appreciated masses or hepatosplenomegaly.  MUSCULOSKELETAL:  Negative joint pain or swelling.  EXTREMITIES:  The patient without any swelling or edema to upper and  lower extremities.  Dressing to recent removal of PICC line, it is  clean, dry, and intact to right upper extremity.  NEUROLOGIC:  The patient able to moves all extremities x4 with no motor  or sensory deficits on exam.  PSYCHOLOGIC:  The patient is alert and oriented x3  with very pleasant  mood and affect.   LABORATORY WORK:  D-dimer greater than 6.0 per Dr. Daiva [name].  TSH  obtained on November 06, 2008, 0.482.   IMPRESSION AND PLAN:  1. Dyspnea on exertion and chest pain.  The patient's symptoms have      now resolved, however, given recent prolonged hospitalization, the      patient is at risk for pulmonary emboli.  We will admit the patient      to telemetry bed, order Lovenox for DVT prophylaxis.  Should CT      angio of the chest be positive for PE, we will add Coumadin with      Lovenox bridge.  We will check bilateral lower extremity venous      Dopplers to rule out DVT with recent calf pain.  Rule out cardiac      etiology of symptoms by checking EKG and cycling cardiac enzymes.      Again, TSH was normal on June 2010.  Should CT angio of the chest      reveal any cardiomegaly, we will check a 2-D echocardiogram.  2. Recent liver abscess.  We will continue oral antibiotics per      Infectious Disease.  The patient is nontoxic appearing on exam.  3. Prophylaxis.  We will add PPI.      [name] Pen, NP      [name] L. Lendell Caprice, MD  Electronically Signed    LE/MEDQ  D:  12/06/2008  T:  12/07/2008  Job:  [id number]   cc:   [name] [name], M.D.  [name] Lav, MD

## 2010-11-01 ENCOUNTER — Inpatient Hospital Stay (INDEPENDENT_AMBULATORY_CARE_PROVIDER_SITE_OTHER)
Admission: RE | Admit: 2010-11-01 | Discharge: 2010-11-01 | Disposition: A | Discharge status: Home / Self Care | Payer: 59 | Source: Ambulatory Visit | Attending: Emergency Medicine | Admitting: Emergency Medicine

## 2010-11-01 ENCOUNTER — Encounter: Payer: Self-pay | Admitting: Emergency Medicine

## 2010-11-01 DIAGNOSIS — J069 Acute upper respiratory infection, unspecified: Secondary | ICD-10-CM

## 2011-02-04 IMAGING — CT CT PELVIS W/ CM
2 of 5 series · 14 of 32 positions shown, 19 images · IV contrast (agent unspecified)
Comparison: 11/06/2008

CT ABDOMEN

CLINICAL DATA: Liver abscess status post drainage, follow-up,
persistent fever

CT ABDOMEN AND PELVIS WITH CONTRAST
TECHNIQUE: Multidetector CT imaging of the abdomen and pelvis was
performed using the standard protocol following bolus
administration of intravenous contrast. Breast shield utilized.
Sagittal and coronal MPR images reconstructed from axial data set.
Contrast: Dilute oral contrast and 100 ml [id number]

[Series 2: routine abdomen · axial · 0.76mm/px · z∈[-441,-81]mm · 7 of 97 slices shown, 12 images]
[im 13/97  soft-tissue]
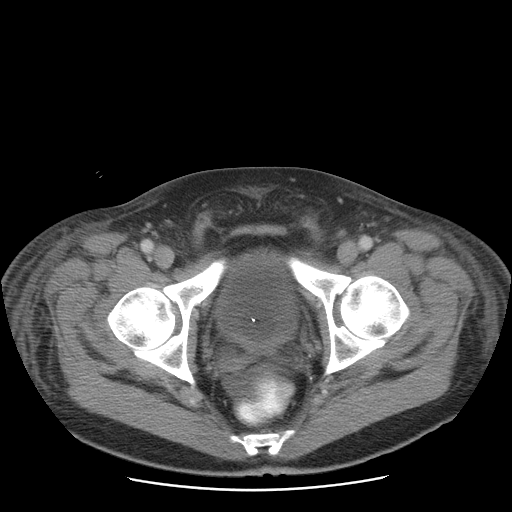
[im 13/97  bone]
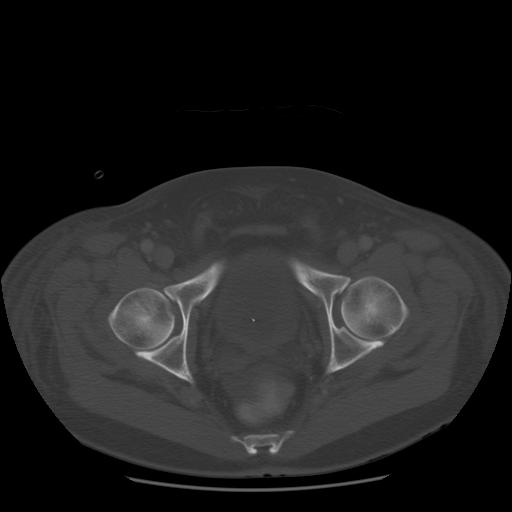
[im 25/97  soft-tissue]
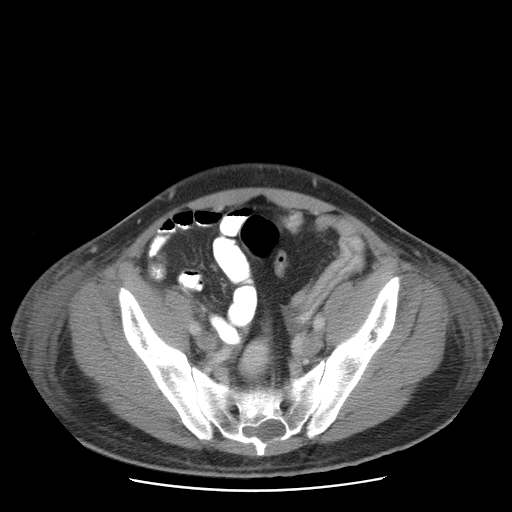
[im 37/97  soft-tissue]
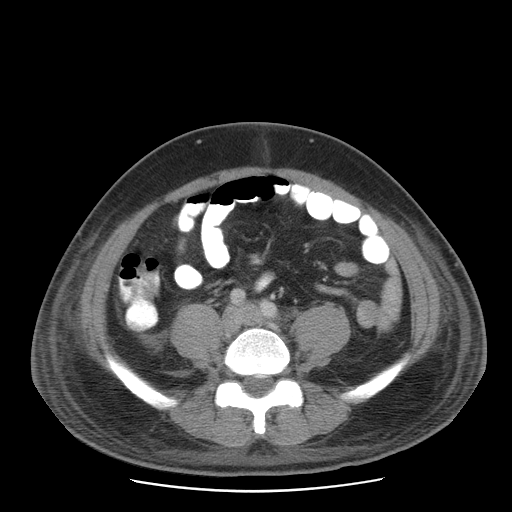
[im 49/97  soft-tissue]
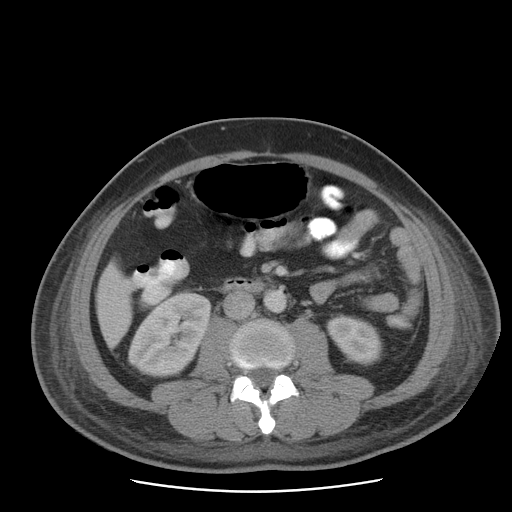
[im 49/97  lung]
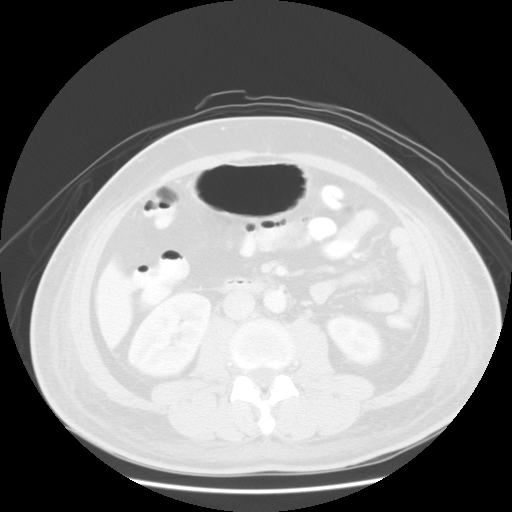
[im 61/97  soft-tissue]
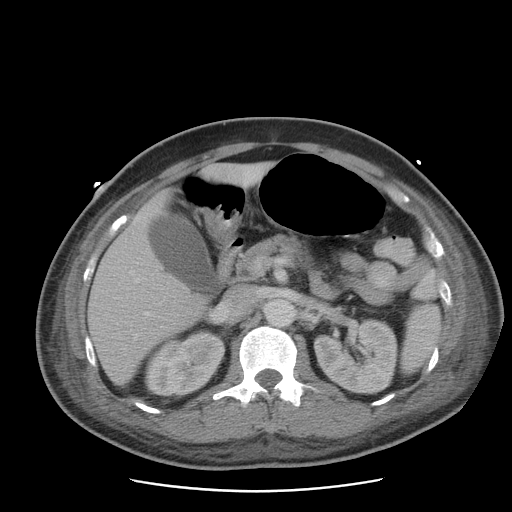
[im 61/97  lung]
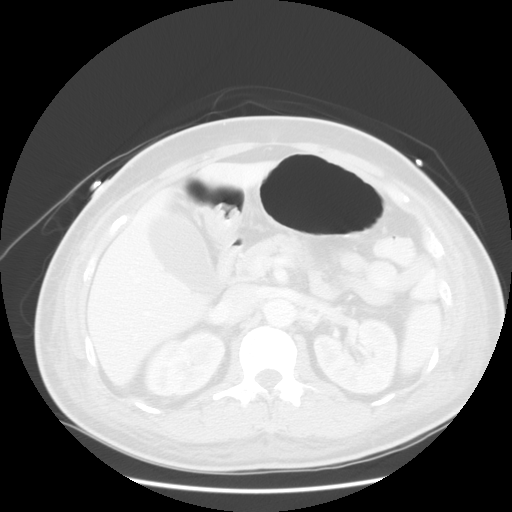
[im 73/97  soft-tissue]
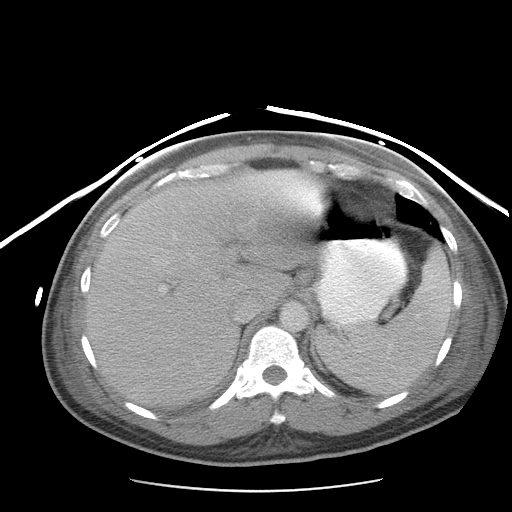
[im 73/97  lung]
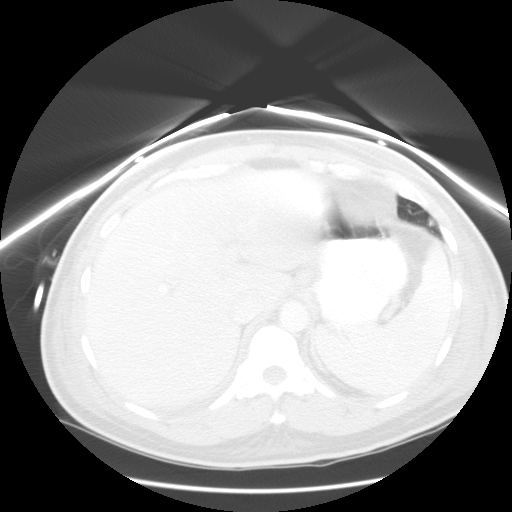
[im 85/97  soft-tissue]
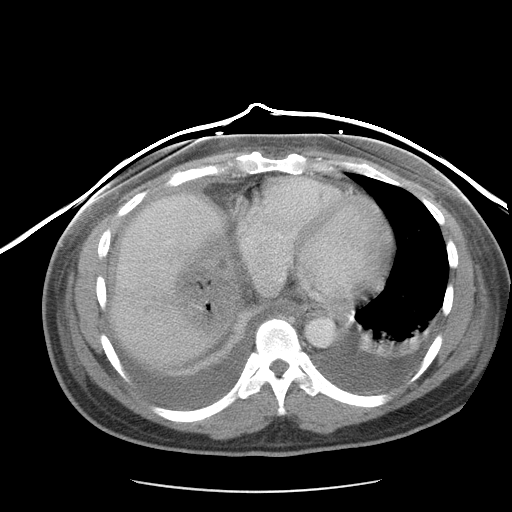
[im 85/97  lung]
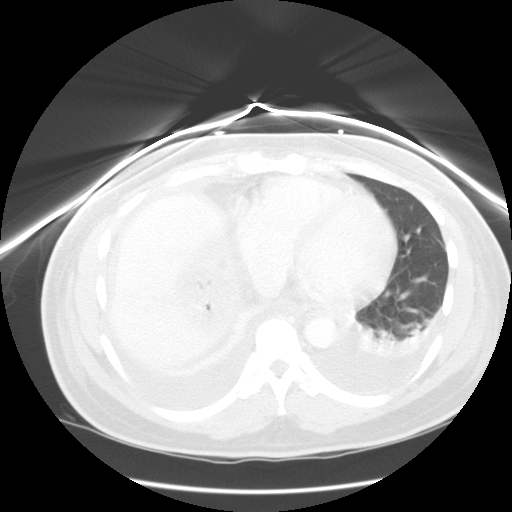

[Series 400: sag · sagittal · 1.04mm/px · 7 of 115 slices shown]
[im 13/115  soft-tissue]
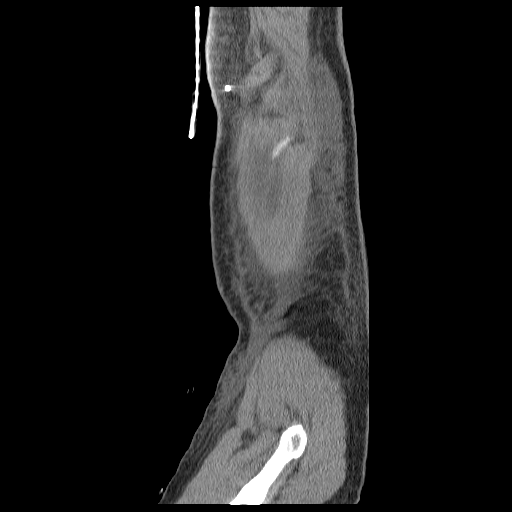
[im 26/115  soft-tissue]
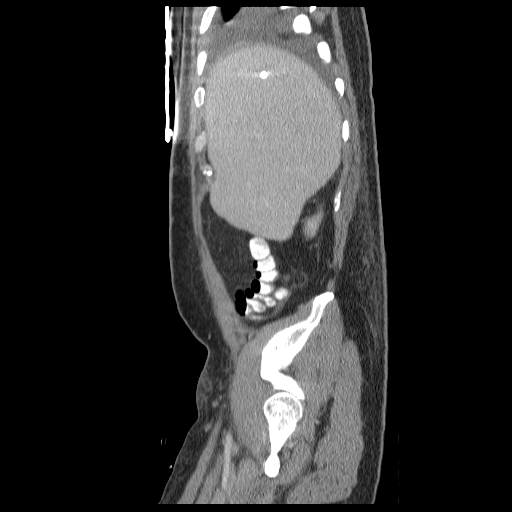
[im 39/115  soft-tissue]
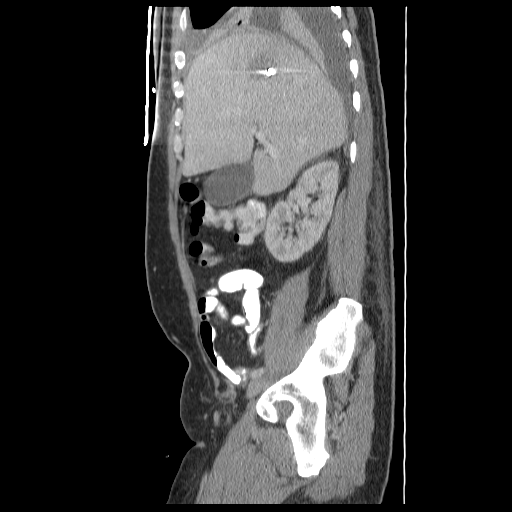
[im 51/115  soft-tissue]
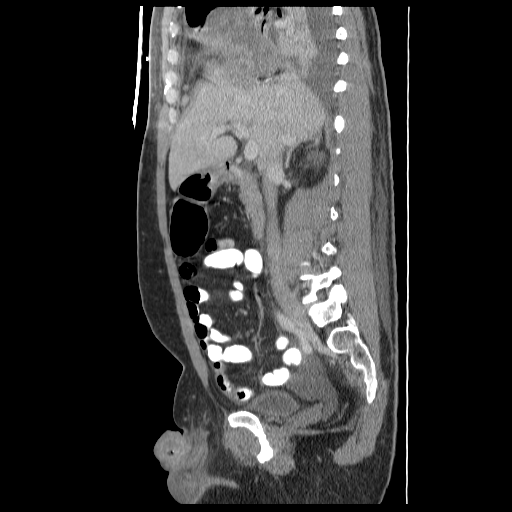
[im 64/115  soft-tissue]
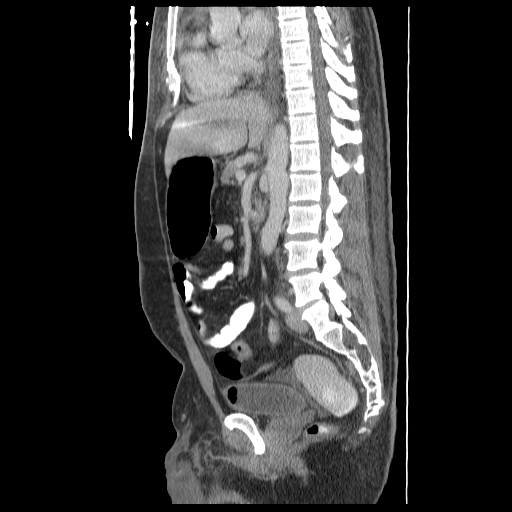
[im 77/115  soft-tissue]
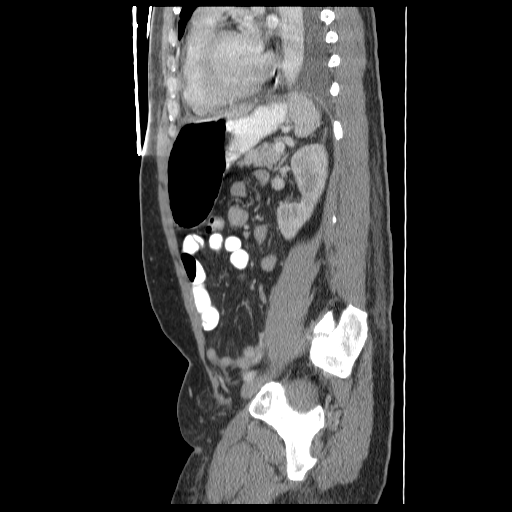
[im 89/115  soft-tissue]
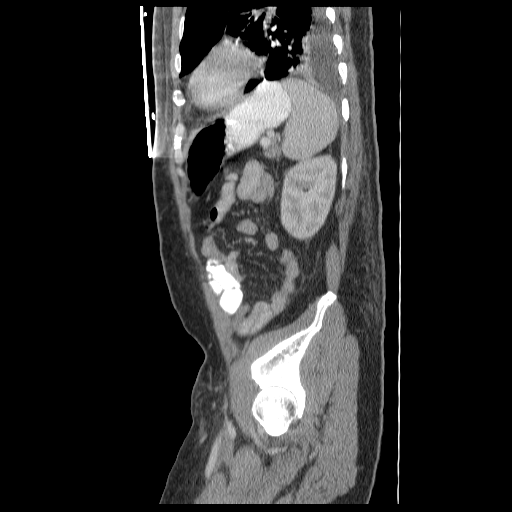

[14 of 32 positions shown; findings below may reference images not displayed]

FINDINGS: Bibasilar pleural effusions and atelectasis, greater on right.
Pigtail drainage catheter identified within previously seen hepatic
abscess.
Area of abnormal attenuation within the liver measures
approximately 7.5 x 4.6 cm image 15.
The fluid component of the abscess has decreased in size since
previous exam, with a single anterior locule remaining visible.
Remainder of liver, spleen, pancreas, kidneys, and adrenal glands
normal.
Stomach and upper abdominal bowel loops unremarkable.
Small amount of free fluid at right pericolic gutter.
No mass, adenopathy, or hernia.
IMPRESSION: Decrease in size of hepatic abscess status post drainage.
Minimal ascites.
Bibasilar pleural effusions and atelectasis, greater on right.

CT PELVIS
FINDINGS: Nonspecific free pelvic fluid.
Large and small bowel loops in pelvis normal.
Normal appendix.
Foley catheter and air within urinary bladder.
No pelvic mass, adenopathy, or hernia.
Bones unremarkable.
IMPRESSION: Free pelvic fluid.

## 2011-02-04 IMAGING — US US PARACENTESIS
1 series · 8 of 8 positions shown · non-contrast
Comparison: none

CLINICAL DATA: Bilateral pleural effusion; right larger than left.

RIGHT ULTRASOUND-GUIDED THORACENTESIS
TECHNIQUE: An ultrasound-guided thoracentesis was thoroughly
discussed with the patient and questions answered.  The benefits,
risks, alternatives and complications were also discussed.  The
patient understands and wishes to proceed with the procedure.  A
verbal as well as written consent was obtained. Ultrasound was
performed to localize and mark an adequate pocket of fluid for
thoracentesis.  The right chest wall was prepped and draped in the
normal sterile fashion.  1% Lidocaine was used for local
anesthesia.  Under ultrasound guidance a 19-gauge Yueh catheter was
introduced yielding approximately 650 ml of bloody fluid.  The
patient tolerated the procedure well and there were no immediate
complications. Post procedure chest x-ray is pending.

[Series 1: us paracentesis · 0.28mm/px · 8 of 8 slices shown]
[im 1/8]
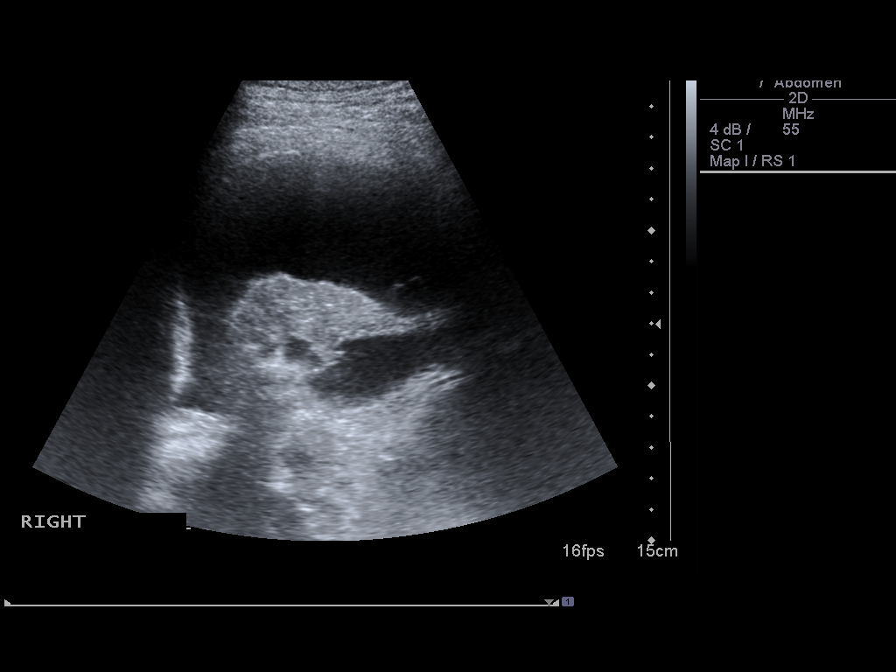
[im 2/8]
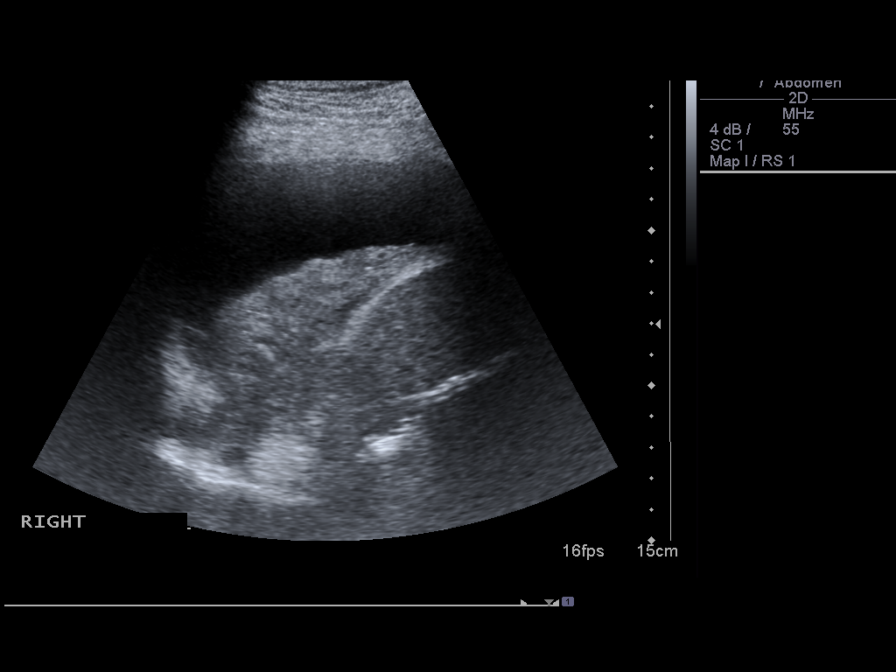
[im 3/8]
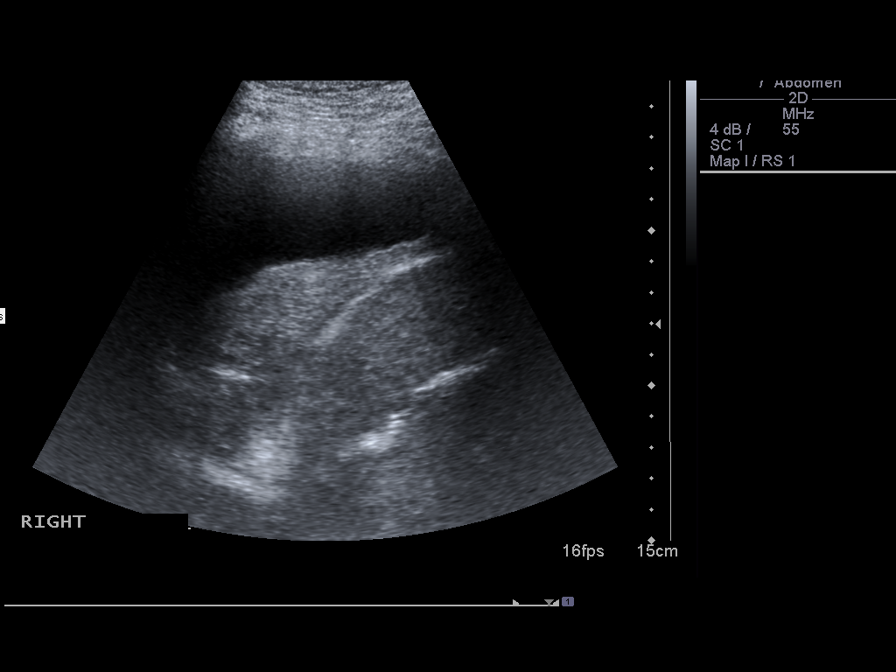
[im 4/8]
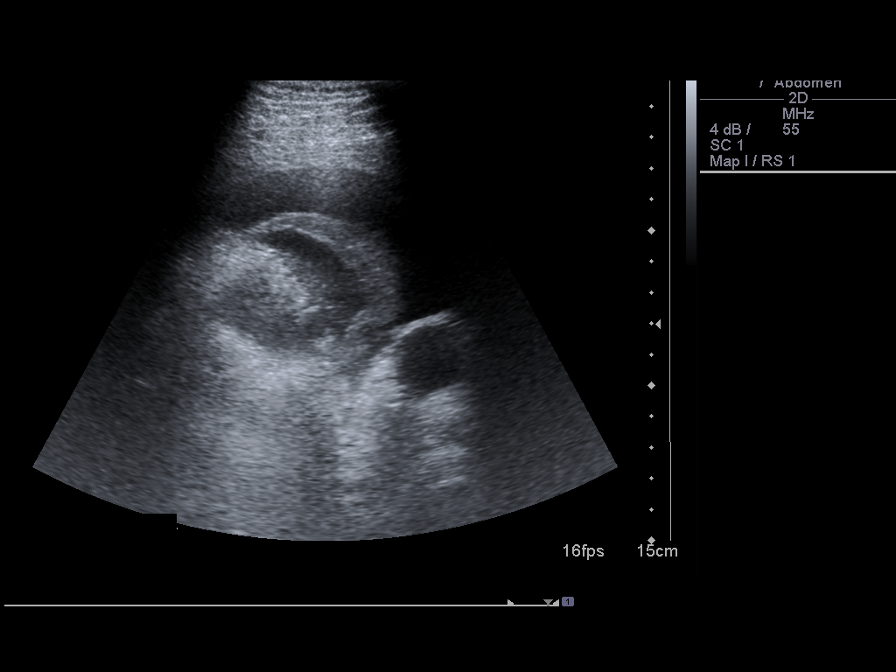
[im 5/8]
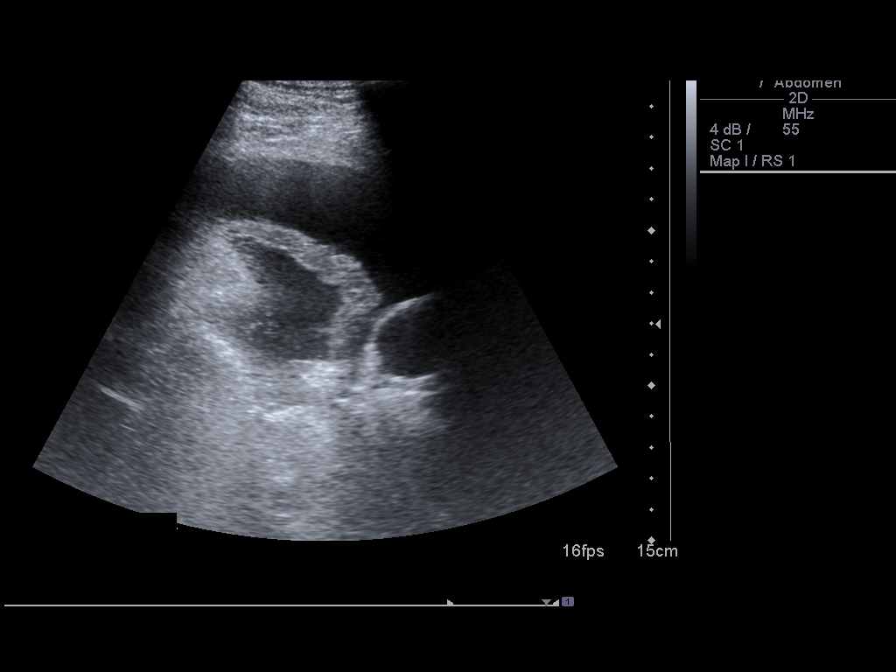
[im 6/8]
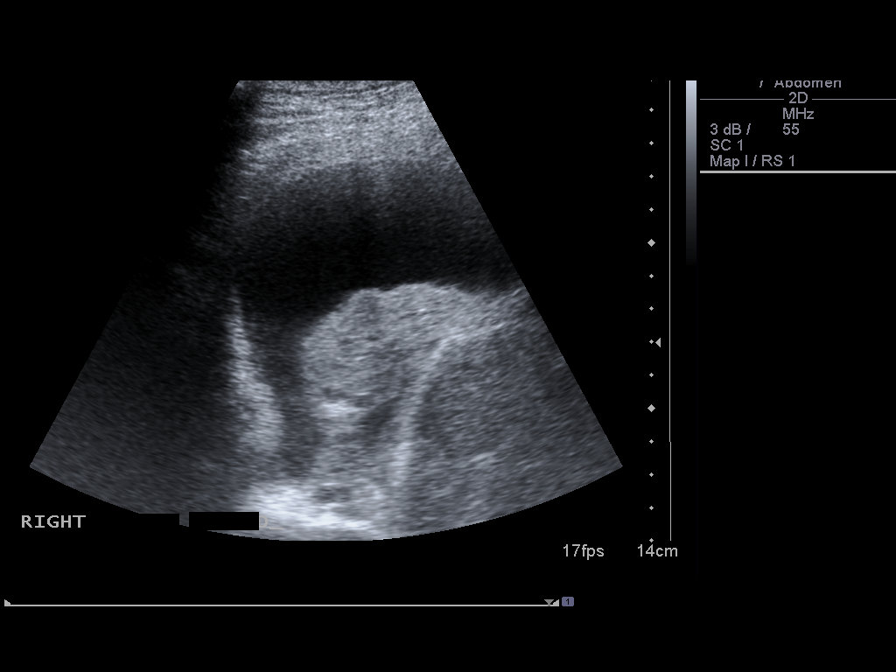
[im 7/8]
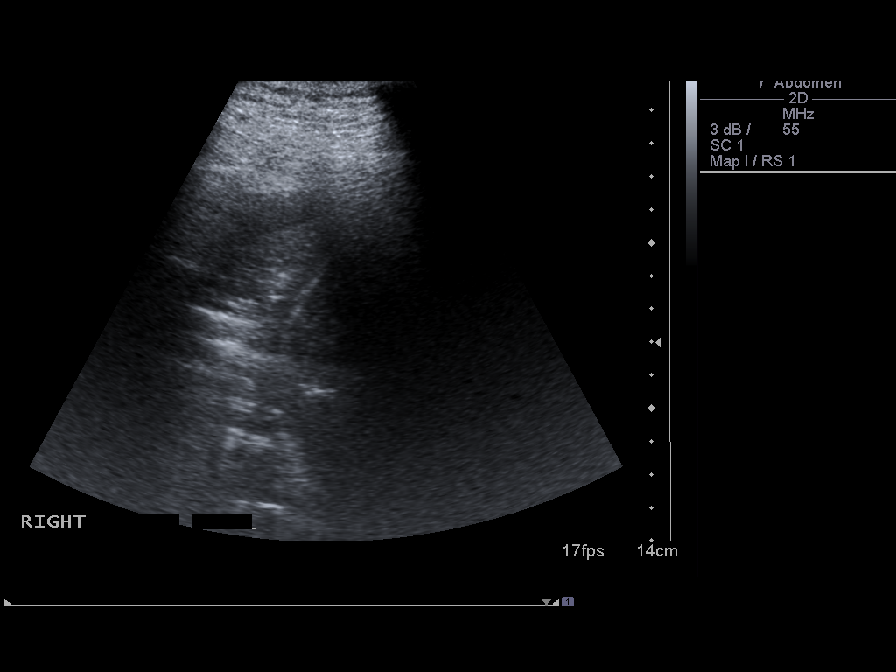
[im 8/8]
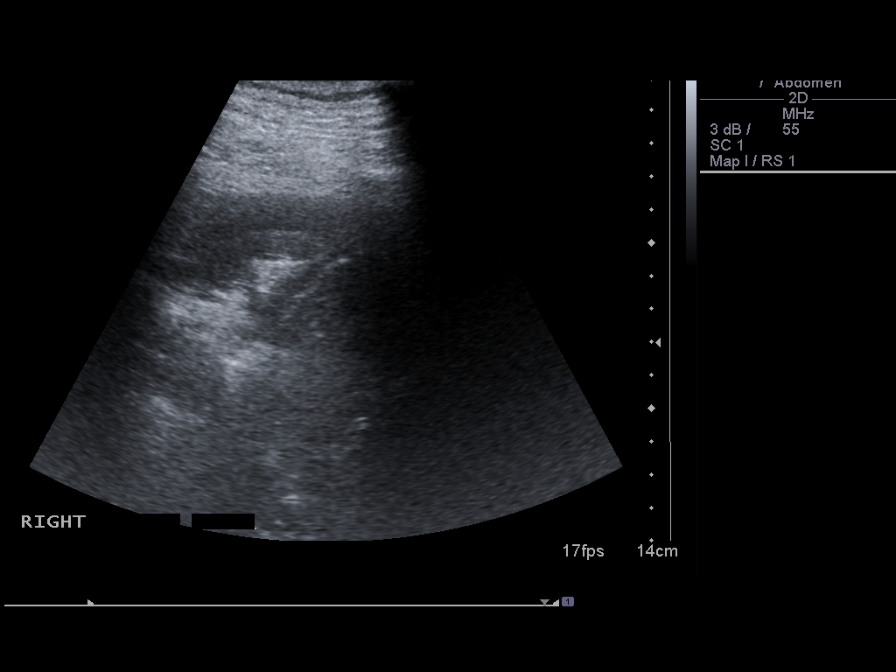

[8 of 8 positions shown; findings below may reference images not displayed]

IMPRESSION: Successful ultrasound-guided right thoracentesis yielding 650 ml of
fluid.

Read by: Isidor, Domi.-MARIIAMM

## 2011-02-06 IMAGING — CR DG CHEST 1V PORT
1 series · 1 of 1 positions shown · non-contrast
Comparison: 11/09/2008

CLINICAL DATA: Pleural effusion

PORTABLE CHEST - 1 VIEW

[view not recorded]
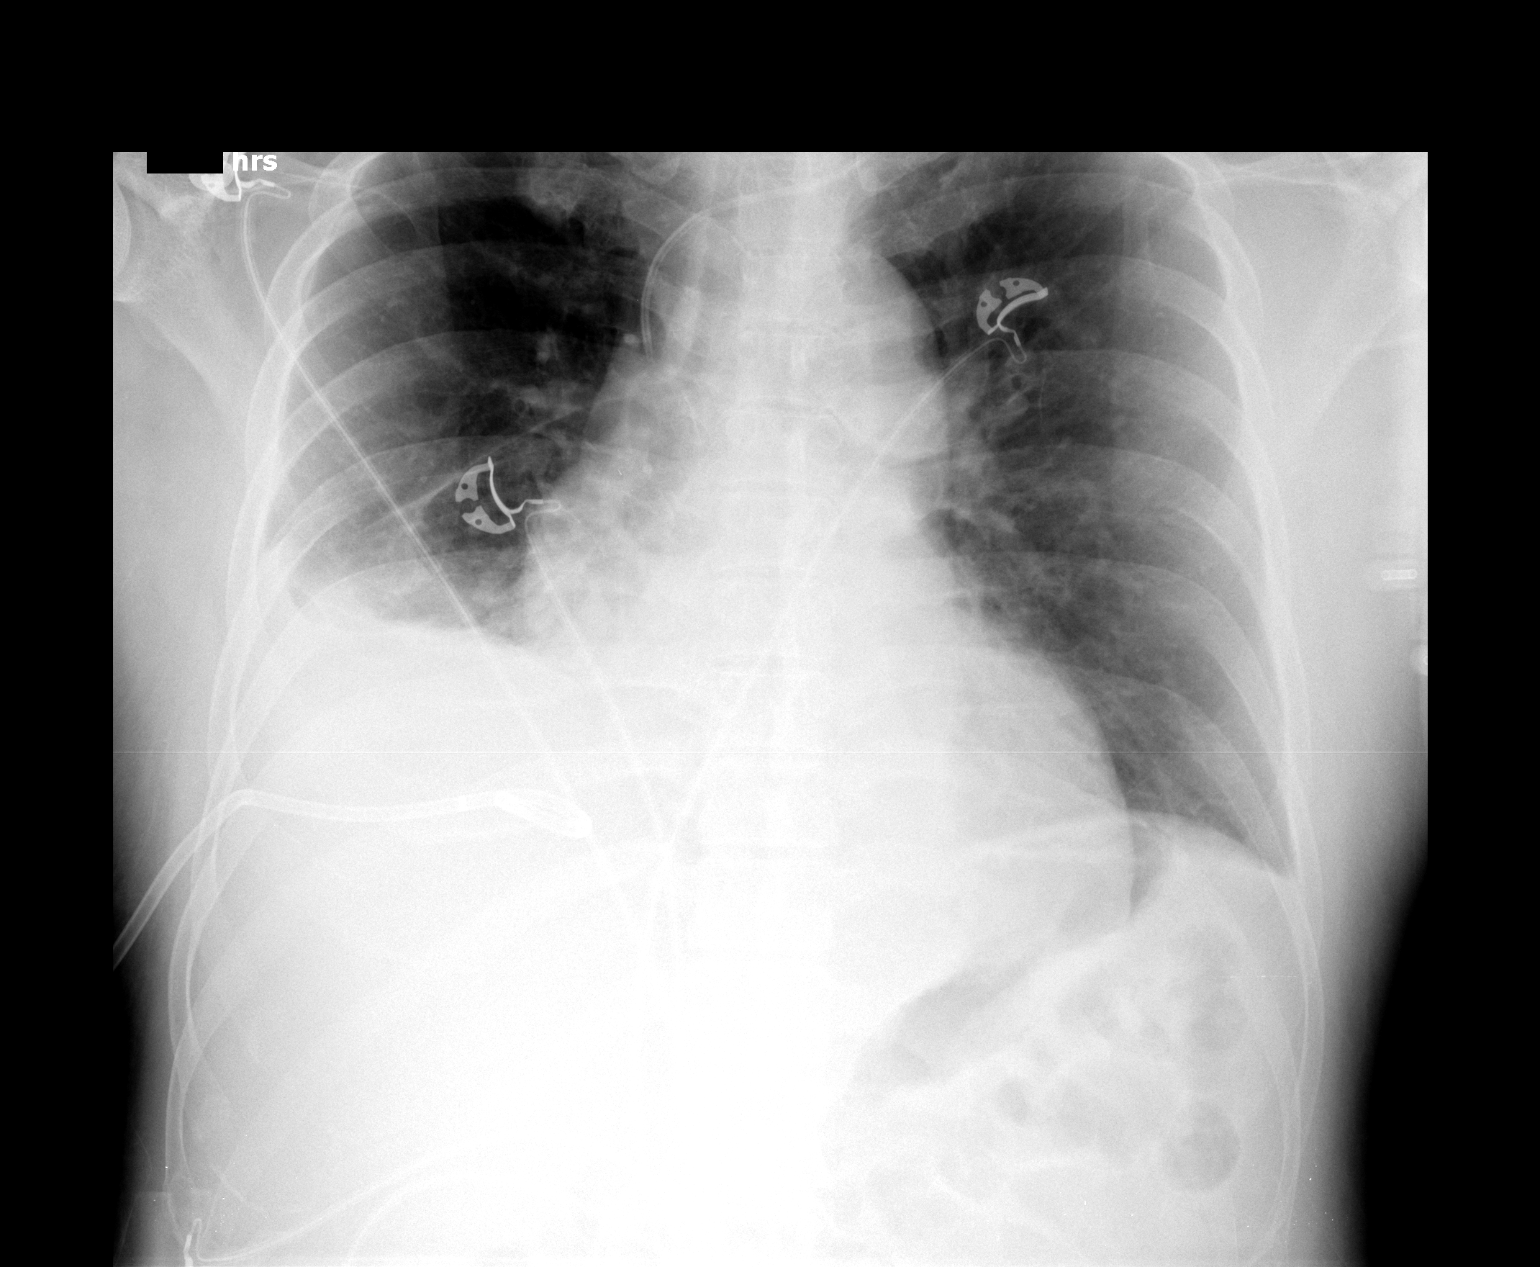

[1 of 1 positions shown; findings below may reference images not displayed]

FINDINGS: Left IJ central line and right upper quadrant drainage
catheter stable in position.  Stable right pleural effusion with
adjacent consolidation / atelectasis in the right lower lung.  Left
lung clear.  Visualized bones unremarkable.
IMPRESSION: 1.  Stable right effusion and right lower lung consolidation /
atelectasis.
2. Support hardware stable in position.

## 2011-05-04 NOTE — Progress Notes (Signed)
Summary: sore throat/cough(rm4)   Vital Signs:  Patient Profile:   45 Years Old Male CC:      URI Height:     68 inches (175.26 cm) Weight:      175 pounds O2 Sat:      97 % O2 treatment:    Room Air Temp:     98.7 degrees F oral Pulse rate:   93 / minute Resp:     20 per minute BP sitting:   145 / 86  Vitals Entered By: Linton Flemings RN (November 01, 2010 5:32 PM)                  Updated Prior Medication List: No Medications Current Allergies: ! LACTOSE (LACTOSE) ! * LATEXHistory of Present Illness History from: patient Chief Complaint: URI History of Present Illness: 45 Years Old Male complains of onset of cold symptoms for 2 days.  [name] [name] has been using nothing OTC. + sore throat + cough No pleuritic pain No wheezing + nasal congestion + post-nasal drainage + sinus pain/pressure No chest congestion No itchy/red eyes No earache No hemoptysis No SOB + chills/sweats No fever No nausea No vomiting No abdominal pain No diarrhea No skin rashes No fatigue No myalgias No headache   REVIEW OF SYSTEMS Constitutional Symptoms      Denies fever, chills, night sweats, weight loss, weight gain, and fatigue.  Eyes       Denies change in vision, eye pain, eye discharge, glasses, contact lenses, and eye surgery. Ear/Nose/Throat/Mouth       Complains of frequent runny nose and sore throat.      Denies hearing loss/aids, change in hearing, ear pain, ear discharge, dizziness, frequent nose bleeds, sinus problems, hoarseness, and tooth pain or bleeding.  Respiratory       Complains of productive cough.      Denies dry cough, wheezing, shortness of breath, asthma, bronchitis, and emphysema/COPD.  Cardiovascular       Denies murmurs, chest pain, and tires easily with exhertion.    Gastrointestinal       Denies stomach pain, nausea/vomiting, diarrhea, constipation, blood in bowel movements, and indigestion. Genitourniary       Denies painful urination, kidney stones,  and loss of urinary control. Neurological       Complains of headaches.      Denies paralysis, seizures, and fainting/blackouts. Musculoskeletal       Complains of muscle pain and joint pain.      Denies joint stiffness, decreased range of motion, redness, swelling, muscle weakness, and gout.  Skin       Denies bruising, unusual mles/lumps or sores, and hair/skin or nail changes.  Psych       Denies mood changes, temper/anger issues, anxiety/stress, speech problems, depression, and sleep problems. Other Comments: symptoms started two days ago   Past History:  Past Surgical History: Reviewed history from 11/04/2009 and no changes required. drainage of liver abcess 10/2009  Family History: Reviewed history from 11/04/2009 and no changes required. Family history is noncontributory.  No FH of Colon Cancer:  Social History: Reviewed history from 11/04/2009 and no changes required. married, employed. He is currently not working. His wife is a weekend relief nurse in the [employer].  He does walk on a regular basis.  There is no alcohol or cigarette intake.  Occupation: Consulting civil engineer Physical Exam General appearance: well developed, well nourished, no acute distress Ears: normal, no lesions or deformities Nasal: clear discharge Oral/Pharynx: clear PND, mild  erythema, no exudates Chest/Lungs: no rales, wheezes, or rhonchi bilateral, breath sounds equal without effort Heart: regular rate and  rhythm, no murmur MSE: oriented to time, place, and person Assessment New Problems: UPPER RESPIRATORY INFECTION, ACUTE (ICD-465.9)   Plan New Medications/Changes: AMOXICILLIN 875 MG TABS (AMOXICILLIN) 1 by mouth two times a day for 7 days  #14 x 0, 11/01/2010, Hoyt Koch MD  New Orders: New Patient Level III [phone] Rapid Strep [19147] Planning Comments:   1)  Take the prescribed antibiotic as instructed.  Hold for a few days since still likely viral.  Radid strep neg. No culture. 2)  Use nasal  saline solution (over the counter) at least 3 times a day. 3)  Use over the counter decongestants like Zyrtec-D every 12 hours as needed to help with congestion. 4)  Can take tylenol every 6 hours or motrin every 8 hours for pain or fever. 5)  Follow up with your primary doctor  if no improvement in 5-7 days, sooner if increasing pain, fever, or new symptoms.    The patient and/or caregiver has been counseled thoroughly with regard to medications prescribed including dosage, schedule, interactions, rationale for use, and possible side effects and they verbalize understanding.  Diagnoses and expected course of recovery discussed and will return if not improved as expected or if the condition worsens. Patient and/or caregiver verbalized understanding.  Prescriptions: AMOXICILLIN 875 MG TABS (AMOXICILLIN) 1 by mouth two times a day for 7 days  #14 x 0   Entered and Authorized by:   Hoyt Koch MD   Signed by:   Hoyt Koch MD on 11/01/2010   Method used:   Print then Give to Patient   RxID:   [id number]   Orders Added: 1)  New Patient Level III [96295] 2)  Rapid Strep [[id number]]    Laboratory Results  Date/Time Received: November 01, 2010 6:14 PM  Date/Time Reported: November 01, 2010 6:14 PM   Other Tests  Rapid Strep: negative  Kit Test Internal QC: Negative   (Normal Range: Negative)

## 2013-03-06 ENCOUNTER — Emergency Department (HOSPITAL_COMMUNITY)
Admission: EM | Admit: 2013-03-06 | Discharge: 2013-03-06 | Disposition: A | Discharge status: Home / Self Care | Payer: 59 | Source: Home / Self Care | Attending: Family Medicine | Admitting: Family Medicine

## 2013-03-06 ENCOUNTER — Encounter (HOSPITAL_COMMUNITY): Payer: Self-pay | Admitting: Emergency Medicine

## 2013-03-06 DIAGNOSIS — R51 Headache: Secondary | ICD-10-CM

## 2013-03-06 HISTORY — DX: Abscess of liver: K75.0

## 2013-03-06 NOTE — ED Notes (Signed)
Right sided facial pain/headache for one week.  Has seen the dentist last week, reports xrays were normal, started taking amoxicillin 500mg  and has taken 8 doses and has stopped taking last night because of diarrhea.

## 2013-03-06 NOTE — ED Provider Notes (Signed)
CSN: [id number]     Arrival date & time 03/06/13  1347 History   First MD Initiated Contact with Patient 03/06/13 1553     Chief Complaint  Patient presents with  . Facial Pain   (Consider location/radiation/quality/duration/timing/severity/associated sxs/prior Treatment) HPI Comments: Pt with right sided cheek facial pain for 1 week. Started out severe and persistent, no relief from hydrocodone or ibuprofen. Less severe now, pt can control pain with ibuprofen, but pain persists. Pain worse with cold food/drink and chewing. Saw dentist earlier this week thinking it was a tooth, but dentist told pt his teeth are fine. Dentist thinks pt may have sinusitis so rx amoxicillin 500mg  TID for 7 days.  Pt took 8 doses and then got diarrhea so stopped taking it- last dose was last night. Pt does feel better since starting amoxicillin but pain persists.   The history is provided by the patient.    Past Medical History  Diagnosis Date  . Liver abscess    History reviewed. No pertinent past surgical history. History reviewed. No pertinent family history. History  Substance Use Topics  . Smoking status: Never Smoker   . Smokeless tobacco: Not on file  . Alcohol Use: No    Review of Systems  Constitutional: Negative for fever and chills.  HENT: Negative for congestion, facial swelling, rhinorrhea, postnasal drip and sinus pressure.     Allergies  Lactose and Latex  Home Medications   Current Outpatient Rx  Name  Route  Sig  Dispense  Refill  . amoxicillin (AMOXIL) 500 MG tablet   Oral   Take 500 mg by mouth 2 (two) times daily.         Marland Kitchen HYDROcodone-acetaminophen (NORCO/VICODIN) 5-325 MG per tablet   Oral   Take 1 tablet by mouth every 6 (six) hours as needed for pain.          BP 150/91  Pulse 72  Temp(Src) 98.1 F (36.7 C) (Oral)  Resp 20  SpO2 97% Physical Exam  Constitutional: He is oriented to person, place, and time. He appears well-developed and well-nourished. No  distress.  HENT:  Right Ear: Tympanic membrane, external ear and ear canal normal.  Left Ear: Tympanic membrane, external ear and ear canal normal.  Nose: Right sinus exhibits no maxillary sinus tenderness and no frontal sinus tenderness. Left sinus exhibits no maxillary sinus tenderness and no frontal sinus tenderness.  Mouth/Throat: Oropharynx is clear and moist and mucous membranes are normal.  Site of pain is right cheek, V2 area of trigeminal nerve  Eyes: EOM are normal.  Neurological: He is alert and oriented to person, place, and time. No cranial nerve deficit.    ED Course  Procedures (including critical care time) Labs Review Labs Reviewed - No data to display Imaging Review No results found.  MDM   1. Facial pain   sinusitis vs trigeminal neuralgia. Refer to neuro for f/u. Pt also to f/u with pcp. Pt to finish amoxicillin and start taking probiotics.      Cathlyn Parsons, NP 03/06/13 [phone]

## 2013-03-06 NOTE — ED Notes (Signed)
Triage process interrupted by call to pharmacy

## 2013-03-08 NOTE — ED Provider Notes (Signed)
Medical screening examination/treatment/procedure(s) were performed by a resident physician or non-physician practitioner and as the supervising physician I was immediately available for consultation/collaboration.  Evan Corey, MD   Evan S Corey, MD 03/08/13 0846 

## 2014-01-30 ENCOUNTER — Other Ambulatory Visit: Payer: Self-pay | Admitting: Family Medicine

## 2014-01-30 DIAGNOSIS — K750 Abscess of liver: Secondary | ICD-10-CM

## 2014-02-19 ENCOUNTER — Other Ambulatory Visit (HOSPITAL_COMMUNITY): Payer: 59

## 2014-02-19 ENCOUNTER — Other Ambulatory Visit: Payer: 59

## 2014-02-20 ENCOUNTER — Encounter (HOSPITAL_COMMUNITY): Payer: Self-pay | Admitting: Family Medicine

## 2014-07-20 ENCOUNTER — Other Ambulatory Visit: Payer: Self-pay | Admitting: Family Medicine

## 2014-07-20 DIAGNOSIS — K750 Abscess of liver: Secondary | ICD-10-CM

## 2014-07-27 ENCOUNTER — Ambulatory Visit
Admission: RE | Admit: 2014-07-27 | Discharge: 2014-07-27 | Disposition: A | Discharge status: Home / Self Care | Payer: 59 | Source: Ambulatory Visit | Attending: Family Medicine | Admitting: Family Medicine

## 2014-07-27 DIAGNOSIS — K750 Abscess of liver: Secondary | ICD-10-CM

## 2014-08-06 ENCOUNTER — Other Ambulatory Visit (HOSPITAL_COMMUNITY): Payer: Self-pay | Admitting: Family Medicine

## 2014-08-06 ENCOUNTER — Other Ambulatory Visit (HOSPITAL_COMMUNITY): Payer: 59

## 2014-08-06 DIAGNOSIS — I517 Cardiomegaly: Secondary | ICD-10-CM

## 2014-08-07 ENCOUNTER — Ambulatory Visit (HOSPITAL_COMMUNITY): Payer: 59 | Attending: Cardiology

## 2014-08-07 ENCOUNTER — Encounter (HOSPITAL_COMMUNITY): Payer: Self-pay | Admitting: Family Medicine

## 2014-08-07 DIAGNOSIS — I517 Cardiomegaly: Secondary | ICD-10-CM | POA: Insufficient documentation

## 2014-08-07 NOTE — Progress Notes (Signed)
2D Echo completed. 08/07/2014

## 2014-10-05 ENCOUNTER — Encounter: Payer: Self-pay | Admitting: Gastroenterology

## 2015-04-03 ENCOUNTER — Encounter: Payer: Self-pay | Admitting: Gastroenterology

## 2015-06-28 MED FILL — LOSARTAN POTASSIUM 25 MG TA: 25 | 30 days supply | Qty: 60 | Fill #4

## 2015-07-29 MED FILL — LOSARTAN POTASSIUM 25 MG TA: 25 | 30 days supply | Qty: 60 | Fill #5

## 2015-08-29 MED FILL — LOSARTAN POTASSIUM 25 MG TA: 25 | 30 days supply | Qty: 60 | Fill #6

## 2015-09-30 MED FILL — LOSARTAN POTASSIUM 25 MG TA: 25 | 30 days supply | Qty: 60 | Fill #7

## 2015-10-24 DIAGNOSIS — J069 Acute upper respiratory infection, unspecified: Secondary | ICD-10-CM | POA: Diagnosis not present

## 2015-10-25 MED FILL — LOSARTAN POTASSIUM 25 MG TA: 25 | 30 days supply | Qty: 60 | Fill #8

## 2015-10-29 MED FILL — AMOXICILLIN 875 MG TABLET: 875 | 10 days supply | Qty: 20 | Fill #0

## 2015-11-27 MED FILL — LOSARTAN POTASSIUM 25 MG TA: 25 | 30 days supply | Qty: 60 | Fill #9

## 2015-12-25 MED FILL — LOSARTAN POTASSIUM 25 MG TA: 25 | 30 days supply | Qty: 60 | Fill #10

## 2016-01-24 MED FILL — LOSARTAN POTASSIUM 25 MG TA: 25 | 30 days supply | Qty: 60 | Fill #11

## 2016-02-06 DIAGNOSIS — R001 Bradycardia, unspecified: Secondary | ICD-10-CM | POA: Diagnosis not present

## 2016-02-06 DIAGNOSIS — I10 Essential (primary) hypertension: Secondary | ICD-10-CM | POA: Diagnosis not present

## 2016-02-06 DIAGNOSIS — R0609 Other forms of dyspnea: Secondary | ICD-10-CM | POA: Diagnosis not present

## 2016-02-06 DIAGNOSIS — Z23 Encounter for immunization: Secondary | ICD-10-CM | POA: Diagnosis not present

## 2016-02-06 MED FILL — LOSARTAN-HCTZ 50-12.5 MG TA: 50-12.5 | 90 days supply | Qty: 90 | Fill #0

## 2016-04-20 DIAGNOSIS — Z0000 Encounter for general adult medical examination without abnormal findings: Secondary | ICD-10-CM | POA: Diagnosis not present

## 2016-04-20 DIAGNOSIS — Z79899 Other long term (current) drug therapy: Secondary | ICD-10-CM | POA: Diagnosis not present

## 2016-04-20 DIAGNOSIS — I10 Essential (primary) hypertension: Secondary | ICD-10-CM | POA: Diagnosis not present

## 2016-04-20 DIAGNOSIS — Z125 Encounter for screening for malignant neoplasm of prostate: Secondary | ICD-10-CM | POA: Diagnosis not present

## 2016-06-11 MED FILL — LOSARTAN-HCTZ 50-12.5 MG TA: 50-12.5 | 90 days supply | Qty: 90 | Fill #1

## 2016-09-17 MED FILL — LOSARTAN-HCTZ 50-12.5 MG TA: 50-12.5 | 30 days supply | Qty: 30 | Fill #0

## 2016-10-27 MED FILL — LOSARTAN-HCTZ 50-12.5 MG TA: 50-12.5 | 30 days supply | Qty: 30 | Fill #1

## 2016-11-30 MED FILL — LOSARTAN POTASSIUM-HCTZ 50-: 50-12.5 | 30 days supply | Qty: 30 | Fill #2

## 2016-12-29 MED FILL — LOSARTAN POTASSIUM-HCTZ 50-: 50-12.5 | 30 days supply | Qty: 30 | Fill #3

## 2017-01-13 MED FILL — AMOXICILLIN 500 MG CAPSULE: 500 | 7 days supply | Qty: 28 | Fill #0

## 2017-02-03 MED FILL — LOSARTAN-HCTZ 50-12.5 MG TA: 50-12.5 | 30 days supply | Qty: 30 | Fill #4

## 2017-03-08 MED FILL — LOSARTAN-HCTZ 50-12.5 MG TA: 50-12.5 | 30 days supply | Qty: 30 | Fill #5

## 2017-04-05 MED FILL — LOSARTAN POTASSIUM-HCTZ 50-: 50-12.5 | 30 days supply | Qty: 30 | Fill #6

## 2017-05-06 MED FILL — LOSARTAN POTASSIUM-HCTZ 50-: 50-12.5 | 30 days supply | Qty: 30 | Fill #7

## 2017-05-17 DIAGNOSIS — Z23 Encounter for immunization: Secondary | ICD-10-CM | POA: Diagnosis not present

## 2017-05-17 DIAGNOSIS — Z0000 Encounter for general adult medical examination without abnormal findings: Secondary | ICD-10-CM | POA: Diagnosis not present

## 2017-05-17 DIAGNOSIS — I10 Essential (primary) hypertension: Secondary | ICD-10-CM | POA: Diagnosis not present

## 2017-05-17 DIAGNOSIS — Z79899 Other long term (current) drug therapy: Secondary | ICD-10-CM | POA: Diagnosis not present

## 2017-05-17 DIAGNOSIS — Z125 Encounter for screening for malignant neoplasm of prostate: Secondary | ICD-10-CM | POA: Diagnosis not present

## 2017-06-02 DIAGNOSIS — Z1211 Encounter for screening for malignant neoplasm of colon: Secondary | ICD-10-CM | POA: Diagnosis not present

## 2017-06-11 MED FILL — LOSARTAN POTASSIUM-HCTZ 50-: 50-12.5 | 30 days supply | Qty: 30 | Fill #8

## 2017-07-14 MED FILL — LOSARTAN POTASSIUM-HCTZ 50-: 50-12.5 | 30 days supply | Qty: 30 | Fill #0

## 2017-08-16 MED FILL — LOSARTAN POTASSIUM-HCTZ 50-: 50-12.5 | 30 days supply | Qty: 30 | Fill #1

## 2017-09-13 MED FILL — LOSARTAN POTASSIUM-HCTZ 50-: 50-12.5 | 30 days supply | Qty: 30 | Fill #2

## 2017-10-15 MED FILL — LOSARTAN POTASSIUM-HCTZ 50-: 50-12.5 | 30 days supply | Qty: 30 | Fill #3

## 2017-11-15 DIAGNOSIS — R05 Cough: Secondary | ICD-10-CM | POA: Diagnosis not present

## 2017-11-15 MED FILL — PROMETHAZINE W/COD SYRUP: 6.25-10 | 5 days supply | Qty: 100 | Fill #0

## 2017-11-15 MED FILL — AZITHROMYCIN 250 MG TABLET: 250 | 5 days supply | Qty: 6 | Fill #0

## 2017-11-23 MED FILL — LOSARTAN POTASSIUM-HCTZ 50-: 50-12.5 | 30 days supply | Qty: 30 | Fill #0

## 2017-12-27 MED FILL — LOSARTAN-HCTZ 50-12.5 MG TA: 50-12.5 | 30 days supply | Qty: 30 | Fill #1

## 2018-01-28 MED FILL — LOSARTAN-HCTZ 50-12.5 MG TA: 50-12.5 | 30 days supply | Qty: 30 | Fill #2

## 2018-02-21 DIAGNOSIS — H524 Presbyopia: Secondary | ICD-10-CM | POA: Diagnosis not present

## 2018-02-28 MED FILL — LOSARTAN-HCTZ 50-12.5 MG TA: 50-12.5 | 30 days supply | Qty: 30 | Fill #4

## 2018-04-05 MED FILL — LOSARTAN-HCTZ 50-12.5 MG TA: 50-12.5 | 30 days supply | Qty: 30 | Fill #5

## 2018-04-25 MED FILL — FLUARIX QUADRIVALENT 0.5 ML: 0.5 | 1 days supply | Qty: 1 | Fill #0

## 2018-05-09 MED FILL — HYDROCHLOROTHIAZIDE 12.5 MG: 12.5 | 30 days supply | Qty: 30 | Fill #0

## 2018-05-09 MED FILL — LOSARTAN POTASSIUM 50 MG TA: 50 | 30 days supply | Qty: 30 | Fill #0

## 2018-05-16 MED FILL — CLINDAMYCIN HCL 150 MG CAPS: 150 | 7 days supply | Qty: 56 | Fill #0

## 2018-06-23 MED FILL — HYDROCHLOROTHIAZIDE 12.5 MG: 12.5 | 30 days supply | Qty: 30 | Fill #1

## 2018-06-23 MED FILL — LOSARTAN POTASSIUM 50 MG TA: 50 | 30 days supply | Qty: 30 | Fill #1

## 2018-07-01 DIAGNOSIS — Z23 Encounter for immunization: Secondary | ICD-10-CM | POA: Diagnosis not present

## 2018-07-01 DIAGNOSIS — Z0000 Encounter for general adult medical examination without abnormal findings: Secondary | ICD-10-CM | POA: Diagnosis not present

## 2018-07-01 DIAGNOSIS — Z79899 Other long term (current) drug therapy: Secondary | ICD-10-CM | POA: Diagnosis not present

## 2018-07-01 DIAGNOSIS — I10 Essential (primary) hypertension: Secondary | ICD-10-CM | POA: Diagnosis not present

## 2018-08-01 MED FILL — HYDROCHLOROTHIAZIDE 12.5 MG: 12.5 | 90 days supply | Qty: 90 | Fill #0

## 2018-08-01 MED FILL — LOSARTAN POTASSIUM 50 MG TA: 50 | 90 days supply | Qty: 90 | Fill #0

## 2018-11-01 MED FILL — HYDROCHLOROTHIAZIDE 12.5 MG: 12.5 | 90 days supply | Qty: 90 | Fill #1

## 2018-11-01 MED FILL — LOSARTAN POTASSIUM 50 MG TA: 50 | 90 days supply | Qty: 90 | Fill #1

## 2018-11-11 DIAGNOSIS — Z23 Encounter for immunization: Secondary | ICD-10-CM | POA: Diagnosis not present

## 2019-01-04 MED FILL — LOSARTAN POTASSIUM 50 MG TA: 50 | 90 days supply | Qty: 90 | Fill #1

## 2019-01-04 MED FILL — HYDROCHLOROTHIAZIDE 12.5 MG: 12.5 | 90 days supply | Qty: 90 | Fill #1

## 2019-03-20 DIAGNOSIS — I10 Essential (primary) hypertension: Secondary | ICD-10-CM | POA: Diagnosis not present

## 2019-03-20 DIAGNOSIS — R0789 Other chest pain: Secondary | ICD-10-CM | POA: Diagnosis not present

## 2019-03-20 DIAGNOSIS — I951 Orthostatic hypotension: Secondary | ICD-10-CM | POA: Diagnosis not present

## 2019-03-20 DIAGNOSIS — Z23 Encounter for immunization: Secondary | ICD-10-CM | POA: Diagnosis not present

## 2019-04-05 ENCOUNTER — Other Ambulatory Visit: Payer: Self-pay

## 2019-04-05 ENCOUNTER — Ambulatory Visit (INDEPENDENT_AMBULATORY_CARE_PROVIDER_SITE_OTHER): Payer: 59 | Admitting: Cardiology

## 2019-04-05 ENCOUNTER — Encounter: Payer: Self-pay | Admitting: Cardiology

## 2019-04-05 VITALS — BP 143/85 | HR 81 | Temp 97.9°F | Ht 68.0 in | Wt 170.2 lb

## 2019-04-05 DIAGNOSIS — E785 Hyperlipidemia, unspecified: Secondary | ICD-10-CM | POA: Diagnosis not present

## 2019-04-05 DIAGNOSIS — I10 Essential (primary) hypertension: Secondary | ICD-10-CM

## 2019-04-05 DIAGNOSIS — R0789 Other chest pain: Secondary | ICD-10-CM | POA: Diagnosis not present

## 2019-04-05 DIAGNOSIS — Z7189 Other specified counseling: Secondary | ICD-10-CM

## 2019-04-05 DIAGNOSIS — R079 Chest pain, unspecified: Secondary | ICD-10-CM

## 2019-04-05 NOTE — Progress Notes (Signed)
Cardiology Office Note:    Date:  04/05/2019   ID:  [name] [name], DOB [birth year], MRN [id number]  PCP:  [name], MD  Cardiologist:  Buford Dresser, MD  Referring MD: [name], MD   CC: new patient consultation for chest tightness  History of Present Illness:    [name] [name] is a 53 y.o. male with a hx of prior hypertension who is seen as a new consult at the request of [name], MD for the evaluation and management of chest tightness.  Recent office note reviewed from Dr. Stephanie Acre' office. On 03/20/19, reported labile blood pressure, lightheadedness, presyncope. Had chest tightness. This was several days after stopping losartan-HCTZ.   Chest pain: -Initial onset: 2 mos ago or so, was lying down, last only moments, very mild. Had second episode, felt it creeping up his chest, more severe, felt dizzy at the time. Last 4-5 minutes. Wondered if his BP was low at the time because he was tingling around his face. Third time was about three weeks ago, again crept up his chest, was more severe. Took his BP, read as 60/40. Slid down so that he wouldn't pass out. Lasted 10-15 minutes. No more since. Has not taken losartan since. Noted that he had felt poorly for 4-5 days before each episode.  -Quality: hard to describe, combination of heaviness/pain -Aggravating/alleviating factors: stops his medication intermittently, feels like when he stops his medication he feels better, not so tired. Notices more with changing position. -Prior cardiac history: reports prior normal stress test in 2012 (I do not have records of this) -Prior ECG: no prior in system -Prior workup: remote stress test not in system, but echo 2016 reviewed. -Prior treatment: none -Alcohol: none -Tobacco: never -Comorbidities: hypertension -Exercise level: does ADLS, cleans regularly, no routine exercise but no limitations. -Cardiac ROS: no shortness of breath, no PND, no orthopnea, no LE edema, no  syncope -Family history: no family history of heart issues that he knows of.  Reports BP since stopping losartan has been 150-160/100. When on losartan, was 130/80-140/90.  Past Medical History:  Diagnosis Date  . Liver abscess     No past surgical history on file.  Current Medications: Current Outpatient Medications on File Prior to Visit  Medication Sig  . HYDROcodone-acetaminophen (NORCO/VICODIN) 5-325 MG per tablet Take 1 tablet by mouth every 6 (six) hours as needed for pain.  Marland Kitchen losartan (COZAAR) 25 MG tablet Patient not taking   No current facility-administered medications on file prior to visit.      Allergies:   Lactose and Latex   Social History   Tobacco Use  . Smoking status: Never Smoker  . Smokeless tobacco: Never Used  Substance Use Topics  . Alcohol use: No  . Drug use: No    Family History: family history is not on file.  ROS:   Please see the history of present illness.  Additional pertinent ROS: Constitutional: Negative for chills, fever, night sweats, unintentional weight loss  HENT: Negative for ear pain and hearing loss.   Eyes: Negative for loss of vision and eye pain.  Respiratory: Negative for cough, sputum, wheezing.   Cardiovascular: See HPI. Gastrointestinal: Negative for abdominal pain, melena, and hematochezia.  Genitourinary: Negative for dysuria and hematuria.  Musculoskeletal: Negative for falls and myalgias.  Skin: Negative for itching and rash.  Neurological: Negative for focal weakness, focal sensory changes and loss of consciousness.  Endo/Heme/Allergies: Does not bruise/bleed easily.     EKGs/Labs/Other Studies Reviewed:  The following studies were reviewed today: Echo 2016 - Left ventricle: The cavity size was normal. There was mild  concentric hypertrophy. Systolic function was normal. The  estimated ejection fraction was in the range of 50% to 55%. Wall  motion was normal; there were no regional wall motion   abnormalities. Doppler parameters are consistent with abnormal  left ventricular relaxation (grade 1 diastolic dysfunction).  There was no evidence of elevated ventricular filling pressure by  Doppler parameters.  - Aortic valve: Trileaflet; normal thickness leaflets. There was no  regurgitation.  - Aortic root: The aortic root was normal in size.  - Mitral valve: Structurally normal valve. There was no  regurgitation.  - Right ventricle: Systolic function was normal.  - Right atrium: The atrium was normal in size.  - Tricuspid valve: There was trivial regurgitation.  - Pulmonary arteries: Systolic pressure was within the normal  range.  - Inferior vena cava: The vessel was normal in size.  - Pericardium, extracardiac: There was no pericardial effusion.   Impressions:   - Normal biventricular size and systolic function.  Mild concentric LVH. Abnormal relaxation with abnormal filling  pressures.  No significant valvular abnormality.   EKG:  EKG is personally reviewed.  The ekg ordered today demonstrates NSR  Recent Labs: No results found for requested labs within last 8760 hours.  Recent Lipid Panel    Component Value Date/Time   CHOL  11/06/2008 0915    102        ATP III CLASSIFICATION:  <200     mg/dL   Desirable  200-239  mg/dL   Borderline High  >=240    mg/dL   High          TRIG 58 11/06/2008 0915   HDL 12 (L) 11/06/2008 0915   CHOLHDL 8.5 11/06/2008 0915   VLDL 12 11/06/2008 0915   LDLCALC  11/06/2008 0915    78        Total Cholesterol/HDL:CHD Risk Coronary Heart Disease Risk Table                     Men   Women  1/2 Average Risk   3.4   3.3  Average Risk       5.0   4.4  2 X Average Risk   9.6   7.1  3 X Average Risk  23.4   11.0        Use the calculated Patient Ratio above and the CHD Risk Table to determine the patient's CHD Risk.        ATP III CLASSIFICATION (LDL):  <100     mg/dL   Optimal  100-129  mg/dL   Near or Above                     Optimal  130-159  mg/dL   Borderline  160-189  mg/dL   High  >190     mg/dL   Very High    Physical Exam:    VS:  BP (!) 143/85   Pulse 81   Temp 97.9 F (36.6 C)   Ht 5\' 8"  (1.727 m)   Wt 170 lb 3.2 oz (77.2 kg)   SpO2 99%   BMI 25.88 kg/m     Wt Readings from Last 3 Encounters:  04/05/19 170 lb 3.2 oz (77.2 kg)  11/01/10 175 lb (79.4 kg)    GEN: Well nourished, well developed in no acute distress HEENT: Normal,  moist mucous membranes NECK: No JVD CARDIAC: regular rhythm, normal S1 and S2, no rubs or gallops. No murmurs. VASCULAR: Radial and DP pulses 2+ bilaterally. No carotid bruits RESPIRATORY:  Clear to auscultation without rales, wheezing or rhonchi  ABDOMEN: Soft, non-tender, non-distended MUSCULOSKELETAL:  Ambulates independently SKIN: Warm and dry, no edema NEUROLOGIC:  Alert and oriented x 3. No focal neuro deficits noted. PSYCHIATRIC:  Normal affect    ASSESSMENT:    1. Chest pain, unspecified type   2. Essential hypertension   3. Cardiac risk counseling   4. Counseling on health promotion and disease prevention   5. Hyperlipidemia, unspecified hyperlipidemia type    PLAN:    Chest tightness: has atypical components, but with CV risk factors, warrants further investigation -discussed treadmill stress, nuclear stress/lexiscan, and CT coronary angiography. Discussed pros and cons of each, including but not limited to false positive/false negative risk, radiation risk, and risk of IV contrast dye. Based on shared decision making, decision was made to pursue treadmill. This will also give [place] a sense of his blood pressure with exercise -ETT  Hyperlipidemia: labs 06/2018 show Tchol 178, HDL 45, LDL 92, TG 204 (unclear if fasting) -risk score, below.  -counseled on diet and exercise  Hypertension: elevated today. Has been on and off medications, states that he feels poorly on meds -discussed long term risk of HTN, especially if elevated -if  raised on ETT, would discuss options for management with his PCP or myself  Cardiac risk counseling and prevention recommendations: -recommend heart healthy/Mediterranean diet, with whole grains, fruits, vegetable, fish, lean meats, nuts, and olive oil. Limit salt. -recommend moderate walking, 3-5 times/week for 30-50 minutes each session. Aim for at least 150 minutes.week. Goal should be pace of 3 miles/hours, or walking 1.5 miles in 30 minutes -recommend avoidance of tobacco products. Avoid excess alcohol. -ASCVD risk score: 6.5%  Plan for follow up: 3-4 weeks, to discuss results and assess BP  Medication Adjustments/Labs and Tests Ordered: Current medicines are reviewed at length with the patient today.  Concerns regarding medicines are outlined above.  Orders Placed This Encounter  Procedures  . EXERCISE TOLERANCE TEST (ETT)  . EKG 12-Lead   No orders of the defined types were placed in this encounter.   Patient Instructions  Medication Instructions:  Your Physician recommend you continue on your current medication as directed.    *If you need a refill on your cardiac medications before your next appointment, please call your pharmacy*  Lab Work: None  Testing/Procedures: Your physician has requested that you have an exercise tolerance test. For further information please visit [email or web address]. Please also follow instruction sheet, as given. [place]. [place]    Follow-Up: At [facility], you and your health needs are our priority.  As part of our continuing mission to provide you with exceptional heart care, we have created designated Provider Care Teams.  These Care Teams include your primary Cardiologist (physician) and Advanced Practice Providers (APPs -  Physician Assistants and Nurse Practitioners) who all work together to provide you with the care you need, when you need it.  Your next appointment:    3-4 weeks  The format for your next appointment:   In Person  Provider:   Dr. [facility] at [facility] [address] [place], [place] [address] Phone:  [phone]        You are scheduled for an  Exercise Stress Test  Please arrive 15 minutes prior to your appointment time for registration and insurance purposes.  The test will take approximately 45 minutes to complete.  How to prepare for your Exercise Stress Test: . Do bring a list of your current medications with you.  If not listed below, you may take your medications as normal. . Do wear comfortable clothes (no dresses or overalls) and walking shoes, tennis shoes preferred (no heels or open toed shoes are allowed) . Do Not wear cologne, perfume, aftershave or lotions (deodorant is allowed). . Please report to [place], [address] for your test.  If these instructions are not followed, your test will have to be rescheduled.  If you have questions or concerns about your appointment, you can call the Stress Lab at [phone].  If you cannot keep your appointment, please provide 24 hours notification to the Stress Lab, to avoid a possible $50 charge to your account    Signed, Buford Dresser, MD PhD 04/05/2019  [place]

## 2019-04-05 NOTE — Patient Instructions (Signed)
Medication Instructions:  Your Physician recommend you continue on your current medication as directed.    *If you need a refill on your cardiac medications before your next appointment, please call your pharmacy*  Lab Work: None  Testing/Procedures: Your physician has requested that you have an exercise tolerance test. For further information please visit [email or web address]. Please also follow instruction sheet, as given. [place]. [place]    Follow-Up: At [facility], you and your health needs are our priority.  As part of our continuing mission to provide you with exceptional heart care, we have created designated Provider Care Teams.  These Care Teams include your primary Cardiologist (physician) and Advanced Practice Providers (APPs -  Physician Assistants and Nurse Practitioners) who all work together to provide you with the care you need, when you need it.  Your next appointment:   3-4 weeks  The format for your next appointment:   In Person  Provider:   Dr. [facility] at [facility] [address] [place], [place] [address] Phone:  [phone]        You are scheduled for an Exercise Stress Test  Please arrive 15 minutes prior to your appointment time for registration and insurance purposes.  The test will take approximately 45 minutes to complete.  How to prepare for your Exercise Stress Test: . Do bring a list of your current medications with you.  If not listed below, you may take your medications as normal. . Do wear comfortable clothes (no dresses or overalls) and walking shoes, tennis shoes preferred (no heels or open toed shoes are allowed) . Do Not wear cologne, perfume, aftershave or lotions (deodorant is allowed). . Please report to [place], [address] for your test.  If these instructions are not followed, your test  will have to be rescheduled.  If you have questions or concerns about your appointment, you can call the Stress Lab at [phone].  If you cannot keep your appointment, please provide 24 hours notification to the Stress Lab, to avoid a possible $50 charge to your account

## 2019-04-07 DIAGNOSIS — I10 Essential (primary) hypertension: Secondary | ICD-10-CM | POA: Insufficient documentation

## 2019-04-25 ENCOUNTER — Telehealth (HOSPITAL_COMMUNITY): Payer: Self-pay

## 2019-04-25 NOTE — Telephone Encounter (Signed)
Encounter complete. 

## 2019-04-28 ENCOUNTER — Other Ambulatory Visit (HOSPITAL_COMMUNITY)
Admission: RE | Admit: 2019-04-28 | Discharge: 2019-04-28 | Disposition: A | Discharge status: Home / Self Care | Payer: 59 | Source: Ambulatory Visit | Attending: Cardiology | Admitting: Cardiology

## 2019-04-28 ENCOUNTER — Other Ambulatory Visit: Payer: Self-pay

## 2019-04-28 DIAGNOSIS — Z20828 Contact with and (suspected) exposure to other viral communicable diseases: Secondary | ICD-10-CM | POA: Diagnosis not present

## 2019-04-28 DIAGNOSIS — Z01812 Encounter for preprocedural laboratory examination: Secondary | ICD-10-CM | POA: Diagnosis not present

## 2019-04-28 LAB — SARS CORONAVIRUS 2 (TAT 6-24 HRS): SARS Coronavirus 2: NEGATIVE

## 2019-05-02 ENCOUNTER — Other Ambulatory Visit: Payer: Self-pay

## 2019-05-02 ENCOUNTER — Ambulatory Visit (HOSPITAL_COMMUNITY)
Admission: RE | Admit: 2019-05-02 | Discharge: 2019-05-02 | Disposition: A | Discharge status: Home / Self Care | Payer: 59 | Source: Ambulatory Visit | Attending: Cardiology | Admitting: Cardiology

## 2019-05-02 DIAGNOSIS — R079 Chest pain, unspecified: Secondary | ICD-10-CM | POA: Diagnosis not present

## 2019-05-02 LAB — EXERCISE TOLERANCE TEST
Estimated workload: 7.5 METS
Exercise duration (min): 6 min
Exercise duration (sec): 20 s
MPHR: 165 {beats}/min
Peak HR: 166 {beats}/min
Percent HR: 100 %
Rest HR: 91 {beats}/min

## 2019-05-03 ENCOUNTER — Telehealth: Payer: Self-pay | Admitting: Cardiology

## 2019-05-03 NOTE — Telephone Encounter (Signed)
Pt aware of gxt results ./cy

## 2019-05-03 NOTE — Telephone Encounter (Signed)
New Message  Pt is calling in regards to his stress test results.  Please call to discuss

## 2019-05-04 MED FILL — HYDROCHLOROTHIAZIDE 12.5 MG: 12.5 | 30 days supply | Qty: 30 | Fill #0

## 2019-05-04 NOTE — Progress Notes (Signed)
Echo not indicated--there is no treatment for left atrial enlargement and it does not change management. It is a common finding on ECG but not diagnostic. Blood pressure control is the best way to make sure there is not increased pressure in the left atrium that can cause enlargement. Thanks.

## 2019-08-30 ENCOUNTER — Telehealth: Payer: Self-pay | Admitting: Cardiology

## 2019-08-30 NOTE — Telephone Encounter (Signed)
Pt c/o of Chest Pain: STAT if CP now or developed within 24 hours  1. Are you having CP right now? no  2. Are you experiencing any other symptoms (ex. SOB, nausea, vomiting, sweating)? Wife denies patient has these symptoms says it is just the chest pain. BP 134/84 HR 81  3. How long have you been experiencing CP? 3 weeks   4. Is your CP continuous or coming and going? Comes and goes and lasts a couple minutes.   5. Have you taken Nitroglycerin? no ?

## 2019-08-30 NOTE — Telephone Encounter (Signed)
Spoke with patient. For the last 2-3 weeks patient has been having chest pain that comes and goes. The chest pain is described as a choking chest pain. He reports it is a tightness that lasts for a few minutes and then it passes. Patient reports it comes out of nowhere when doing any activity around his house. Last episode was today and patient had to go lie down and wait for it to pass. NO chest pain at this time. Activity makes the pain worse. Nothing makes the pain better. No other symptoms when the pain comes. The pain is located in the middle of the chest and slightly to the left. Patient instructed to report to the nearest emergency room if the pain comes back. Patient placed on schedule for tomorrow morning at 8:45 with [name] Deforest, PA. Patient verbalized understanding.

## 2019-08-31 ENCOUNTER — Ambulatory Visit (INDEPENDENT_AMBULATORY_CARE_PROVIDER_SITE_OTHER): Payer: 59 | Admitting: Physician Assistant

## 2019-08-31 ENCOUNTER — Other Ambulatory Visit: Payer: Self-pay

## 2019-08-31 ENCOUNTER — Encounter: Payer: Self-pay | Admitting: Physician Assistant

## 2019-08-31 ENCOUNTER — Telehealth: Payer: Self-pay | Admitting: Cardiology

## 2019-08-31 VITALS — BP 135/90 | HR 86 | Temp 97.7°F | Ht 68.0 in | Wt 176.8 lb

## 2019-08-31 DIAGNOSIS — E785 Hyperlipidemia, unspecified: Secondary | ICD-10-CM

## 2019-08-31 DIAGNOSIS — I200 Unstable angina: Secondary | ICD-10-CM | POA: Diagnosis not present

## 2019-08-31 DIAGNOSIS — I10 Essential (primary) hypertension: Secondary | ICD-10-CM

## 2019-08-31 LAB — BASIC METABOLIC PANEL
BUN/Creatinine Ratio: 21 — ABNORMAL HIGH (ref 9–20)
BUN: 24 mg/dL (ref 6–24)
CO2: 23 mmol/L (ref 20–29)
Calcium: 9.3 mg/dL (ref 8.7–10.2)
Chloride: 104 mmol/L (ref 96–106)
Creatinine, Ser: 1.14 mg/dL (ref 0.76–1.27)
GFR calc Af Amer: 83 mL/min/{1.73_m2} (ref >59)
GFR calc non Af Amer: 72 mL/min/{1.73_m2} (ref >59)
Glucose: 87 mg/dL (ref 65–99)
Potassium: 4.5 mmol/L (ref 3.5–5.2)
Sodium: 142 mmol/L (ref 134–144)

## 2019-08-31 LAB — CBC
Hematocrit: 43.8 % (ref 37.5–51.0)
Hemoglobin: 15.2 g/dL (ref 13.0–17.7)
MCH: 30.2 pg (ref 26.6–33.0)
MCHC: 34.7 g/dL (ref 31.5–35.7)
MCV: 87 fL (ref 79–97)
Platelets: 304 10*3/uL (ref 150–450)
RBC: 5.04 x10E6/uL (ref 4.14–5.80)
RDW: 12.8 % (ref 11.6–15.4)
WBC: 6.9 10*3/uL (ref 3.4–10.8)

## 2019-08-31 MED ORDER — NITROGLYCERIN 0.4 MG SL SUBL: 0.4000 mg | SUBLINGUAL_TABLET | SUBLINGUAL | 5 refills | Status: DC | PRN

## 2019-08-31 MED FILL — NITROGLYCERIN 0.4 MG TAB SL: 0.4 | 7 days supply | Qty: 25 | Fill #0

## 2019-08-31 NOTE — H&P (View-Only) (Signed)
Cardiology Office Note:    Date:  09/02/2019   ID:  [name] [name], DOB 31-Aug-1965, MRN [id number]  PCP:  [name], MD  Cardiologist:  Buford Dresser, MD  Electrophysiologist:  None   Referring MD: [name], MD   No chief complaint on file.   History of Present Illness:    [name] [name] is a 54 y.o. male with a hx of HTN and HLD who was previously seen in November 2020 by Dr. Harrell Gave for evaluation of chest tightness.  He reported a prior abnormal stress test in 2012, however no record was available.  Echocardiogram obtained in 2016 showed EF 50 to 55%, no regional wall motion abnormality, grade 1 DD.  Due to atypical component, Dr. Harrell Gave recommend the ETT to further evaluate.  ETT was performed on 05/02/2019 which showed no ST changes, negative stress test.  Systolic blood pressures rose to 215/85 with exercise.  Patient presents today for cardiology office visit.  He is the husband of one of the nurse on [facility].  He says he did not have any chest pain while doing the treadmill stress test last year.  After the trick treadmill test, his chest discomfort went away for a while.  He was having dizzy spell on the losartan and hydrochlorothiazide in the past, therefore hydrochlorothiazide was discontinued.  He is currently on only 12.5 mg daily of losartan.  This seems to be controlling his blood pressure quite well.  His systolic blood pressure has been in the 130s at home.  In the past 4 weeks or so, since changing his medication, he has been noticing intermittent substernal chest pain radiating to the left side.  He noticed the symptom more so when he is walking around or doing activity.  Last week there was an episode occurring at rest when he is lying down as well.  He admits to have tingling sensation in the left hand, however denies any other associated symptom.  He says that frequency and duration of his chest discomfort has been increasing in  the past few days.  I discussed his symptoms with Dr. Martinique who is agreeable to proceed with cardiac catheterization.  I also discussed the plan with his wife and the patient as well for were both agreeable.  We will obtain CBC and basic metabolic panel.  He was started on the aspirin today until the cardiac catheterization.  I will also give him a prescription for sublingual nitroglycerin.  He is aware that if he continued to have chest pain after his third nitroglycerin, he needs will need to call 911 and go to the hospital.  Otherwise we will try to set up for cardiac catheterization as early as possible.   Past Medical History:  Diagnosis Date  . Hyperlipidemia   . Hypertension   . Liver abscess     No past surgical history on file.  Current Medications: Current Meds  Medication Sig  . HYDROcodone-acetaminophen (NORCO/VICODIN) 5-325 MG per tablet Take 1 tablet by mouth every 6 (six) hours as needed for pain.  Marland Kitchen losartan (COZAAR) 25 MG tablet Take 12.5 mg by mouth daily.     Allergies:   Lactose and Latex   Social History   Socioeconomic History  . Marital status: Married    Spouse name: Not on file  . Number of children: Not on file  . Years of education: Not on file  . Highest education level: Not on file  Occupational History  . Not  on file  Tobacco Use  . Smoking status: Never Smoker  . Smokeless tobacco: Never Used  Substance and Sexual Activity  . Alcohol use: No  . Drug use: No  . Sexual activity: Not on file  Other Topics Concern  . Not on file  Social History Narrative  . Not on file   Social Determinants of Health   Financial Resource Strain:   . Difficulty of Paying Living Expenses:   Food Insecurity:   . Worried About Charity fundraiser in the Last Year:   . Arboriculturist in the Last Year:   Transportation Needs:   . Film/video editor (Medical):   Marland Kitchen Lack of Transportation (Non-Medical):   Physical Activity:   . Days of Exercise per Week:     . Minutes of Exercise per Session:   Stress:   . Feeling of Stress :   Social Connections:   . Frequency of Communication with Friends and Family:   . Frequency of Social Gatherings with Friends and Family:   . Attends Religious Services:   . Active Member of Clubs or Organizations:   . Attends Archivist Meetings:   Marland Kitchen Marital Status:      Family History: The patient's family history includes Diabetes Mellitus II in his maternal grandmother.  ROS:   Please see the history of present illness.     All other systems reviewed and are negative.  EKGs/Labs/Other Studies Reviewed:    The following studies were reviewed today:  Echo 08/07/2014 LV EF: 50% -  55%   -------------------------------------------------------------------  Indications:   Left ventricular hypertrophy (I51.7).   -------------------------------------------------------------------  History:  Risk factors: Pleurisy. Dyspepsia. Nausea.  Hypertension.   -------------------------------------------------------------------  Study Conclusions   - Left ventricle: The cavity size was normal. There was mild  concentric hypertrophy. Systolic function was normal. The  estimated ejection fraction was in the range of 50% to 55%. Wall  motion was normal; there were no regional wall motion  abnormalities. Doppler parameters are consistent with abnormal  left ventricular relaxation (grade 1 diastolic dysfunction).  There was no evidence of elevated ventricular filling pressure by  Doppler parameters.  - Aortic valve: Trileaflet; normal thickness leaflets. There was no  regurgitation.  - Aortic root: The aortic root was normal in size.  - Mitral valve: Structurally normal valve. There was no  regurgitation.  - Right ventricle: Systolic function was normal.  - Right atrium: The atrium was normal in size.  - Tricuspid valve: There was trivial regurgitation.  - Pulmonary arteries: Systolic  pressure was within the normal  range.  - Inferior vena cava: The vessel was normal in size.  - Pericardium, extracardiac: There was no pericardial effusion.   Impressions:   - Normal biventricular size and systolic function.  Mild concentric LVH. Abnormal relaxation with abnormal filling  pressures.  No significant valvular abnormality.    EKG:  EKG is ordered today.  The ekg ordered today demonstrates NSR without significant ST-T wave changes  Recent Labs: 08/31/2019: BUN 24; Creatinine, Ser 1.14; Hemoglobin 15.2; Platelets 304; Potassium 4.5; Sodium 142  Recent Lipid Panel    Component Value Date/Time   CHOL  11/06/2008 0915    102        ATP III CLASSIFICATION:  <200     mg/dL   Desirable  200-239  mg/dL   Borderline High  >=240    mg/dL   High  TRIG 58 11/06/2008 0915   HDL 12 (L) 11/06/2008 0915   CHOLHDL 8.5 11/06/2008 0915   VLDL 12 11/06/2008 0915   LDLCALC  11/06/2008 0915    78        Total Cholesterol/HDL:CHD Risk Coronary Heart Disease Risk Table                     Men   Women  1/2 Average Risk   3.4   3.3  Average Risk       5.0   4.4  2 X Average Risk   9.6   7.1  3 X Average Risk  23.4   11.0        Use the calculated Patient Ratio above and the CHD Risk Table to determine the patient's CHD Risk.        ATP III CLASSIFICATION (LDL):  <100     mg/dL   Optimal  100-129  mg/dL   Near or Above                    Optimal  130-159  mg/dL   Borderline  160-189  mg/dL   High  >190     mg/dL   Very High    Physical Exam:    VS:  BP 135/90   Pulse 86   Temp 97.7 F (36.5 C)   Ht 5\' 8"  (1.727 m)   Wt 176 lb 12.8 oz (80.2 kg)   SpO2 95%   BMI 26.88 kg/m     Wt Readings from Last 3 Encounters:  08/31/19 176 lb 12.8 oz (80.2 kg)  04/05/19 170 lb 3.2 oz (77.2 kg)  11/01/10 175 lb (79.4 kg)     GEN:  Well nourished, well developed in no acute distress HEENT: Normal NECK: No JVD; No carotid bruits LYMPHATICS: No  lymphadenopathy CARDIAC: RRR, no murmurs, rubs, gallops RESPIRATORY:  Clear to auscultation without rales, wheezing or rhonchi  ABDOMEN: Soft, non-tender, non-distended MUSCULOSKELETAL:  No edema; No deformity  SKIN: Warm and dry NEUROLOGIC:  Alert and oriented x 3 PSYCHIATRIC:  Normal affect   ASSESSMENT:    1. Progressive angina (HCC)    PLAN:    In order of problems listed above:  1. Progressive angina: Patient does not have any prior history of CAD.  Previous ETT in 2020 was normal, however recent symptom is quite concerning especially when the symptom increase in frequency and duration and also seems to occur more so with physical activity.  Given the progressive nature of his symptoms, I do not think waiting for coronary CT is a good idea in this case.  I discussed the case with Dr. Martinique, we eventually recommend a outpatient cardiac catheterization next week.  Obtain CBC, basic metabolic panel and a [id number] test.  The case has been discussed with the patient's wife who is one of our cardiac nurse on the [employer].  - Risk and benefit of procedure explained to the patient who display clear understanding and agree to proceed.  Discussed with patient possible procedural risk include bleeding, vascular injury, renal injury, arrythmia, MI, stroke and loss of limb or life.  -Patient given sublingual nitroglycerin.  I also instructed the patient to take aspirin from now until the cardiac catheterization.  Whether or not he will be on aspirin after the cardiac catheterization depend on the cath result.  2. Hypertension: Well-controlled on low-dose losartan.  3. Hyperlipidemia: He is currently not taking any statin medication.  If he truly has coronary artery disease on cardiac catheterization, he will need to be placed on the statin therapy   Medication Adjustments/Labs and Tests Ordered: Current medicines are reviewed at length with the patient today.  Concerns regarding medicines are  outlined above.  Orders Placed This Encounter  Procedures  . CBC  . Basic metabolic panel  . EKG 12-Lead   Meds ordered this encounter  Medications  . DISCONTD: nitroGLYCERIN (NITROSTAT) 0.4 MG SL tablet    Sig: Place 1 tablet (0.4 mg total) under the tongue every 5 (five) minutes as needed for chest pain.    Dispense:  25 tablet    Refill:  5  . DISCONTD: nitroGLYCERIN (NITROSTAT) 0.4 MG SL tablet    Sig: Place 1 tablet (0.4 mg total) under the tongue every 5 (five) minutes as needed for chest pain.    Dispense:  25 tablet    Refill:  5    Patient Instructions  Medication Instructions:   start Aspirin 81 mg  One tablet daily    May use Nitroglycerin  0.4 mg   As needed for chest discomfort 00 See information  Below. *If you need a refill on your cardiac medications before your next appointment, please call your pharmacy*   Lab Work: CBC BMP today  If you have labs (blood work) drawn today and your tests are completely normal, you will receive your results only by: Marland Kitchen MyChart Message (if you have MyChart) OR . A paper copy in the mail If you have any lab test that is abnormal or we need to change your treatment, we will call you to review the results.   Testing/Procedures: Will be schedule at  [facility] - Cardiac  Cath Lab on September 05, 2019 Your physician has requested that you have a cardiac catheterization. Cardiac catheterization is used to diagnose and/or treat various heart conditions. Doctors may recommend this procedure for a number of different reasons. The most common reason is to evaluate chest pain. Chest pain can be a symptom of coronary artery disease (CAD), and cardiac catheterization can show whether plaque is narrowing or blocking your heart's arteries. This procedure is also used to evaluate the valves, as well as measure the blood flow and oxygen levels in different parts of your heart. For further information please visit [email or web address]. Please follow  instruction sheet, as given.    Follow-Up: At [facility], you and your health needs are our priority.  As part of our continuing mission to provide you with exceptional heart care, we have created designated Provider Care Teams.  These Care Teams include your primary Cardiologist (physician) and Advanced Practice Providers (APPs -  Physician Assistants and Nurse Practitioners) who all work together to provide you with the care you need, when you need it.  We recommend signing up for the patient portal called "MyChart".  Sign up information is provided on this After Visit Summary.  MyChart is used to connect with patients for Virtual Visits (Telemedicine).  Patients are able to view lab/test results, encounter notes, upcoming appointments, etc.  Non-urgent messages can be sent to your provider as well.   To learn more about what you can do with MyChart, go to [email or web address].    Your next appointment:   2 week(s)  The format for your next appointment:   In Person  Provider:   Buford Dresser, MD or Almyra Deforest PA   Other Instructions      [place]  [facility] [address] [address] Dept: [phone] Loc: [phone]  [name] [name]  08/31/2019  You are scheduled for a Cardiac Catheterization on Tuesday, April 6 with Dr. Glenetta Hew.  1. Please arrive at the [facility] (Main Entrance A) at [facility]: [address] [place], [place] [address] at 7:00 AM (This time is two hours before your procedure to ensure your preparation). Free valet parking service is available.   Special note: Every effort is made to have your procedure done on time. Please understand that emergencies sometimes delay scheduled procedures.  2. Diet: Do not eat solid foods after midnight.  The patient may have clear liquids until 5am upon the day of the procedure.  3. Labs: You will need  to have blood drawn today  - CBC,BMP , . You do not need to be fasting.  on Friday  September 01, 2019  At 12:20 pm  - Go to  [facility]  [facility]  - then you will need to self quarantine until  April 6 ,2021   4. Medication instructions in preparation for your procedure:   Do not take Losartan the morning of procedure   On the morning of your procedure, take your Aspirin 81 mg and any morning medicines NOT listed above.  You may use sips of water.  5. Plan for one night stay--bring personal belongings. 6. Bring a current list of your medications and current insurance cards. 7. You MUST have a responsible person to drive you home. 8. Someone MUST be with you the first 24 hours after you arrive home or your discharge will be delayed. 9. Please wear clothes that are easy to get on and off and wear slip-on shoes.  Thank you for allowing [place] to care for you!   -- [facility]    .Nitroglycerin sublingual tablets What is this medicine? NITROGLYCERIN (nye troe GLI ser in) is a type of vasodilator. It relaxes blood vessels, increasing the blood and oxygen supply to your heart. This medicine is used to relieve chest pain caused by angina. It is also used to prevent chest pain before activities like climbing stairs, going outdoors in cold weather, or sexual activity. This medicine may be used for other purposes; ask your health care provider or pharmacist if you have questions. COMMON BRAND NAME(S): Nitroquick, Nitrostat, Nitrotab What should I tell my health care provider before I take this medicine? They need to know if you have any of these conditions:  anemia  head injury, recent stroke, or bleeding in the brain  liver disease  previous heart attack  an unusual or allergic reaction to nitroglycerin, other medicines, foods, dyes, or preservatives  pregnant or trying to get pregnant  breast-feeding How should I use this medicine? Take this medicine by  mouth as needed. At the first sign of an angina attack (chest pain or tightness) place one tablet under your tongue. You can also take this medicine 5 to 10 minutes before an event likely to produce chest pain. Follow the directions on the prescription label. Let the tablet dissolve under the tongue. Do not swallow whole. Replace the dose if you accidentally swallow it. It will help if your mouth is not dry. Saliva around the tablet will help it to dissolve more quickly. Do not eat or drink, smoke or chew tobacco while a tablet is dissolving. If you are not better within 5 minutes after taking ONE dose of  nitroglycerin, call 9-1-1 immediately to seek emergency medical care. Do not take more than 3 nitroglycerin tablets over 15 minutes. If you take this medicine often to relieve symptoms of angina, your doctor or health care professional may provide you with different instructions to manage your symptoms. If symptoms do not go away after following these instructions, it is important to call 9-1-1 immediately. Do not take more than 3 nitroglycerin tablets over 15 minutes. Talk to your pediatrician regarding the use of this medicine in children. Special care may be needed. Overdosage: If you think you have taken too much of this medicine contact a poison control center or emergency room at once. NOTE: This medicine is only for you. Do not share this medicine with others. What if I miss a dose? This does not apply. This medicine is only used as needed. What may interact with this medicine? Do not take this medicine with any of the following medications:  certain migraine medicines like ergotamine and dihydroergotamine (DHE)  medicines used to treat erectile dysfunction like sildenafil, tadalafil, and vardenafil  riociguat This medicine may also interact with the following medications:  alteplase  aspirin  heparin  medicines for high blood pressure  medicines for mental depression  other  medicines used to treat angina  phenothiazines like chlorpromazine, mesoridazine, prochlorperazine, thioridazine This list may not describe all possible interactions. Give your health care provider a list of all the medicines, herbs, non-prescription drugs, or dietary supplements you use. Also tell them if you smoke, drink alcohol, or use illegal drugs. Some items may interact with your medicine. What should I watch for while using this medicine? Tell your doctor or health care professional if you feel your medicine is no longer working. Keep this medicine with you at all times. Sit or lie down when you take your medicine to prevent falling if you feel dizzy or faint after using it. Try to remain calm. This will help you to feel better faster. If you feel dizzy, take several deep breaths and lie down with your feet propped up, or bend forward with your head resting between your knees. You may get drowsy or dizzy. Do not drive, use machinery, or do anything that needs mental alertness until you know how this drug affects you. Do not stand or sit up quickly, especially if you are an older patient. This reduces the risk of dizzy or fainting spells. Alcohol can make you more drowsy and dizzy. Avoid alcoholic drinks. Do not treat yourself for coughs, colds, or pain while you are taking this medicine without asking your doctor or health care professional for advice. Some ingredients may increase your blood pressure. What side effects may I notice from receiving this medicine? Side effects that you should report to your doctor or health care professional as soon as possible:  blurred vision  dry mouth  skin rash  sweating  the feeling of extreme pressure in the head  unusually weak or tired Side effects that usually do not require medical attention (report to your doctor or health care professional if they continue or are bothersome):  flushing of the face or neck  headache  irregular  heartbeat, palpitations  nausea, vomiting This list may not describe all possible side effects. Call your doctor for medical advice about side effects. You may report side effects to FDA at 1-800-FDA-1088. Where should I keep my medicine? Keep out of the reach of children. Store at room temperature between 20 and 25 degrees C (68 and  77 degrees F). Store in Chief of Staff. Protect from light and moisture. Keep tightly closed. Throw away any unused medicine after the expiration date. NOTE: This sheet is a summary. It may not cover all possible information. If you have questions about this medicine, talk to your doctor, pharmacist, or health care provider.  2020 Elsevier/Gold Standard (2013-03-16 17:57:36)        Signed, Almyra Deforest, PA  09/02/2019 12:16 PM    [facility]

## 2019-08-31 NOTE — Progress Notes (Signed)
Cardiology Office Note:    Date:  09/02/2019   ID:  [name] [name], DOB [birth year], MRN [id number]  PCP:  [name], MD  Cardiologist:  Buford Dresser, MD  Electrophysiologist:  None   Referring MD: [name], MD   No chief complaint on file.   History of Present Illness:    [name] [name] is a 54 y.o. male with a hx of HTN and HLD who was previously seen in November 2020 by Dr. Harrell Gave for evaluation of chest tightness.  He reported a prior abnormal stress test in 2012, however no record was available.  Echocardiogram obtained in 2016 showed EF 50 to 55%, no regional wall motion abnormality, grade 1 DD.  Due to atypical component, Dr. Harrell Gave recommend the ETT to further evaluate.  ETT was performed on 05/02/2019 which showed no ST changes, negative stress test.  Systolic blood pressures rose to 215/85 with exercise.  Patient presents today for cardiology office visit.  He is the husband of one of the nurse on [facility].  He says he did not have any chest pain while doing the treadmill stress test last year.  After the trick treadmill test, his chest discomfort went away for a while.  He was having dizzy spell on the losartan and hydrochlorothiazide in the past, therefore hydrochlorothiazide was discontinued.  He is currently on only 12.5 mg daily of losartan.  This seems to be controlling his blood pressure quite well.  His systolic blood pressure has been in the 130s at home.  In the past 4 weeks or so, since changing his medication, he has been noticing intermittent substernal chest pain radiating to the left side.  He noticed the symptom more so when he is walking around or doing activity.  Last week there was an episode occurring at rest when he is lying down as well.  He admits to have tingling sensation in the left hand, however denies any other associated symptom.  He says that frequency and duration of his chest discomfort has been increasing in  the past few days.  I discussed his symptoms with Dr. Martinique who is agreeable to proceed with cardiac catheterization.  I also discussed the plan with his wife and the patient as well for were both agreeable.  We will obtain CBC and basic metabolic panel.  He was started on the aspirin today until the cardiac catheterization.  I will also give him a prescription for sublingual nitroglycerin.  He is aware that if he continued to have chest pain after his third nitroglycerin, he needs will need to call 911 and go to the hospital.  Otherwise we will try to set up for cardiac catheterization as early as possible.   Past Medical History:  Diagnosis Date  . Hyperlipidemia   . Hypertension   . Liver abscess     No past surgical history on file.  Current Medications: Current Meds  Medication Sig  . HYDROcodone-acetaminophen (NORCO/VICODIN) 5-325 MG per tablet Take 1 tablet by mouth every 6 (six) hours as needed for pain.  Marland Kitchen losartan (COZAAR) 25 MG tablet Take 12.5 mg by mouth daily.     Allergies:   Lactose and Latex   Social History   Socioeconomic History  . Marital status: Married    Spouse name: Not on file  . Number of children: Not on file  . Years of education: Not on file  . Highest education level: Not on file  Occupational History  . Not  on file  Tobacco Use  . Smoking status: Never Smoker  . Smokeless tobacco: Never Used  Substance and Sexual Activity  . Alcohol use: No  . Drug use: No  . Sexual activity: Not on file  Other Topics Concern  . Not on file  Social History Narrative  . Not on file   Social Determinants of Health   Financial Resource Strain:   . Difficulty of Paying Living Expenses:   Food Insecurity:   . Worried About Charity fundraiser in the Last Year:   . Arboriculturist in the Last Year:   Transportation Needs:   . Film/video editor (Medical):   Marland Kitchen Lack of Transportation (Non-Medical):   Physical Activity:   . Days of Exercise per Week:     . Minutes of Exercise per Session:   Stress:   . Feeling of Stress :   Social Connections:   . Frequency of Communication with Friends and Family:   . Frequency of Social Gatherings with Friends and Family:   . Attends Religious Services:   . Active Member of Clubs or Organizations:   . Attends Archivist Meetings:   Marland Kitchen Marital Status:      Family History: The patient's family history includes Diabetes Mellitus II in his maternal grandmother.  ROS:   Please see the history of present illness.     All other systems reviewed and are negative.  EKGs/Labs/Other Studies Reviewed:    The following studies were reviewed today:  Echo 08/07/2014 LV EF: 50% -  55%   -------------------------------------------------------------------  Indications:   Left ventricular hypertrophy (I51.7).   -------------------------------------------------------------------  History:  Risk factors: Pleurisy. Dyspepsia. Nausea.  Hypertension.   -------------------------------------------------------------------  Study Conclusions   - Left ventricle: The cavity size was normal. There was mild  concentric hypertrophy. Systolic function was normal. The  estimated ejection fraction was in the range of 50% to 55%. Wall  motion was normal; there were no regional wall motion  abnormalities. Doppler parameters are consistent with abnormal  left ventricular relaxation (grade 1 diastolic dysfunction).  There was no evidence of elevated ventricular filling pressure by  Doppler parameters.  - Aortic valve: Trileaflet; normal thickness leaflets. There was no  regurgitation.  - Aortic root: The aortic root was normal in size.  - Mitral valve: Structurally normal valve. There was no  regurgitation.  - Right ventricle: Systolic function was normal.  - Right atrium: The atrium was normal in size.  - Tricuspid valve: There was trivial regurgitation.  - Pulmonary arteries: Systolic  pressure was within the normal  range.  - Inferior vena cava: The vessel was normal in size.  - Pericardium, extracardiac: There was no pericardial effusion.   Impressions:   - Normal biventricular size and systolic function.  Mild concentric LVH. Abnormal relaxation with abnormal filling  pressures.  No significant valvular abnormality.    EKG:  EKG is ordered today.  The ekg ordered today demonstrates NSR without significant ST-T wave changes  Recent Labs: 08/31/2019: BUN 24; Creatinine, Ser 1.14; Hemoglobin 15.2; Platelets 304; Potassium 4.5; Sodium 142  Recent Lipid Panel    Component Value Date/Time   CHOL  11/06/2008 0915    102        ATP III CLASSIFICATION:  <200     mg/dL   Desirable  200-239  mg/dL   Borderline High  >=240    mg/dL   High  TRIG 58 11/06/2008 0915   HDL 12 (L) 11/06/2008 0915   CHOLHDL 8.5 11/06/2008 0915   VLDL 12 11/06/2008 0915   LDLCALC  11/06/2008 0915    78        Total Cholesterol/HDL:CHD Risk Coronary Heart Disease Risk Table                     Men   Women  1/2 Average Risk   3.4   3.3  Average Risk       5.0   4.4  2 X Average Risk   9.6   7.1  3 X Average Risk  23.4   11.0        Use the calculated Patient Ratio above and the CHD Risk Table to determine the patient's CHD Risk.        ATP III CLASSIFICATION (LDL):  <100     mg/dL   Optimal  100-129  mg/dL   Near or Above                    Optimal  130-159  mg/dL   Borderline  160-189  mg/dL   High  >190     mg/dL   Very High    Physical Exam:    VS:  BP 135/90   Pulse 86   Temp 97.7 F (36.5 C)   Ht 5\' 8"  (1.727 m)   Wt 176 lb 12.8 oz (80.2 kg)   SpO2 95%   BMI 26.88 kg/m     Wt Readings from Last 3 Encounters:  08/31/19 176 lb 12.8 oz (80.2 kg)  04/05/19 170 lb 3.2 oz (77.2 kg)  11/01/10 175 lb (79.4 kg)     GEN:  Well nourished, well developed in no acute distress HEENT: Normal NECK: No JVD; No carotid bruits LYMPHATICS: No  lymphadenopathy CARDIAC: RRR, no murmurs, rubs, gallops RESPIRATORY:  Clear to auscultation without rales, wheezing or rhonchi  ABDOMEN: Soft, non-tender, non-distended MUSCULOSKELETAL:  No edema; No deformity  SKIN: Warm and dry NEUROLOGIC:  Alert and oriented x 3 PSYCHIATRIC:  Normal affect   ASSESSMENT:    1. Progressive angina (HCC)    PLAN:    In order of problems listed above:  1. Progressive angina: Patient does not have any prior history of CAD.  Previous ETT in 2020 was normal, however recent symptom is quite concerning especially when the symptom increase in frequency and duration and also seems to occur more so with physical activity.  Given the progressive nature of his symptoms, I do not think waiting for coronary CT is a good idea in this case.  I discussed the case with Dr. Martinique, we eventually recommend a outpatient cardiac catheterization next week.  Obtain CBC, basic metabolic panel and a [id number] test.  The case has been discussed with the patient's wife who is one of our cardiac nurse on the 6[facility].  - Risk and benefit of procedure explained to the patient who display clear understanding and agree to proceed.  Discussed with patient possible procedural risk include bleeding, vascular injury, renal injury, arrythmia, MI, stroke and loss of limb or life.  -Patient given sublingual nitroglycerin.  I also instructed the patient to take aspirin from now until the cardiac catheterization.  Whether or not he will be on aspirin after the cardiac catheterization depend on the cath result.  2. Hypertension: Well-controlled on low-dose losartan.  3. Hyperlipidemia: He is currently not taking any statin medication.  If he truly has coronary artery disease on cardiac catheterization, he will need to be placed on the statin therapy   Medication Adjustments/Labs and Tests Ordered: Current medicines are reviewed at length with the patient today.  Concerns regarding medicines are  outlined above.  Orders Placed This Encounter  Procedures  . CBC  . Basic metabolic panel  . EKG 12-Lead   Meds ordered this encounter  Medications  . DISCONTD: nitroGLYCERIN (NITROSTAT) 0.4 MG SL tablet    Sig: Place 1 tablet (0.4 mg total) under the tongue every 5 (five) minutes as needed for chest pain.    Dispense:  25 tablet    Refill:  5  . DISCONTD: nitroGLYCERIN (NITROSTAT) 0.4 MG SL tablet    Sig: Place 1 tablet (0.4 mg total) under the tongue every 5 (five) minutes as needed for chest pain.    Dispense:  25 tablet    Refill:  5    Patient Instructions  Medication Instructions:   start Aspirin 81 mg  One tablet daily    May use Nitroglycerin  0.4 mg   As needed for chest discomfort 00 See information  Below. *If you need a refill on your cardiac medications before your next appointment, please call your pharmacy*   Lab Work: CBC BMP today  If you have labs (blood work) drawn today and your tests are completely normal, you will receive your results only by: Marland Kitchen MyChart Message (if you have MyChart) OR . A paper copy in the mail If you have any lab test that is abnormal or we need to change your treatment, we will call you to review the results.   Testing/Procedures: Will be schedule at  [facility] - Cardiac  Cath Lab on September 05, 2019 Your physician has requested that you have a cardiac catheterization. Cardiac catheterization is used to diagnose and/or treat various heart conditions. Doctors may recommend this procedure for a number of different reasons. The most common reason is to evaluate chest pain. Chest pain can be a symptom of coronary artery disease (CAD), and cardiac catheterization can show whether plaque is narrowing or blocking your heart's arteries. This procedure is also used to evaluate the valves, as well as measure the blood flow and oxygen levels in different parts of your heart. For further information please visit [email or web address]. Please follow  instruction sheet, as given.    Follow-Up: At [facility] you and your health needs are our priority.  As part of our continuing mission to provide you with exceptional heart care, we have created designated Provider Care Teams.  These Care Teams include your primary Cardiologist (physician) and Advanced Practice Providers (APPs -  Physician Assistants and Nurse Practitioners) who all work together to provide you with the care you need, when you need it.  We recommend signing up for the patient portal called "MyChart".  Sign up information is provided on this After Visit Summary.  MyChart is used to connect with patients for Virtual Visits (Telemedicine).  Patients are able to view lab/test results, encounter notes, upcoming appointments, etc.  Non-urgent messages can be sent to your provider as well.   To learn more about what you can do with MyChart, go to [email or web address].    Your next appointment:   2 week(s)  The format for your next appointment:   In Person  Provider:   Buford Dresser, MD or Almyra Deforest PA   Other Instructions      [place]  [facility] [address] Dept: [phone] Loc: [phone]  Alastor Tivnan  08/31/2019  You are scheduled for a Cardiac Catheterization on Tuesday, April 6 with Dr. Glenetta Hew.  1. Please arrive at the [facility] (Main Entrance A) at [facility]: [address] [place], [place] [address] at 7:00 AM (This time is two hours before your procedure to ensure your preparation). Free valet parking service is available.   Special note: Every effort is made to have your procedure done on time. Please understand that emergencies sometimes delay scheduled procedures.  2. Diet: Do not eat solid foods after midnight.  The patient may have clear liquids until 5am upon the day of the procedure.  3. Labs: You will need  to have blood drawn today  - CBC,BMP , . You do not need to be fasting.  on Friday  September 01, 2019  At 12:20 pm  - Go to  [place]  [facility]  - then you will need to self quarantine until  April 6 ,2021   4. Medication instructions in preparation for your procedure:   Do not take Losartan the morning of procedure   On the morning of your procedure, take your Aspirin 81 mg and any morning medicines NOT listed above.  You may use sips of water.  5. Plan for one night stay--bring personal belongings. 6. Bring a current list of your medications and current insurance cards. 7. You MUST have a responsible person to drive you home. 8. Someone MUST be with you the first 24 hours after you arrive home or your discharge will be delayed. 9. Please wear clothes that are easy to get on and off and wear slip-on shoes.  Thank you for allowing [place] to care for you!   -- [facility]    .Nitroglycerin sublingual tablets What is this medicine? NITROGLYCERIN (nye troe GLI ser in) is a type of vasodilator. It relaxes blood vessels, increasing the blood and oxygen supply to your heart. This medicine is used to relieve chest pain caused by angina. It is also used to prevent chest pain before activities like climbing stairs, going outdoors in cold weather, or sexual activity. This medicine may be used for other purposes; ask your health care provider or pharmacist if you have questions. COMMON BRAND NAME(S): Nitroquick, Nitrostat, Nitrotab What should I tell my health care provider before I take this medicine? They need to know if you have any of these conditions:  anemia  head injury, recent stroke, or bleeding in the brain  liver disease  previous heart attack  an unusual or allergic reaction to nitroglycerin, other medicines, foods, dyes, or preservatives  pregnant or trying to get pregnant  breast-feeding How should I use this medicine? Take this medicine by  mouth as needed. At the first sign of an angina attack (chest pain or tightness) place one tablet under your tongue. You can also take this medicine 5 to 10 minutes before an event likely to produce chest pain. Follow the directions on the prescription label. Let the tablet dissolve under the tongue. Do not swallow whole. Replace the dose if you accidentally swallow it. It will help if your mouth is not dry. Saliva around the tablet will help it to dissolve more quickly. Do not eat or drink, smoke or chew tobacco while a tablet is dissolving. If you are not better within 5 minutes after taking ONE dose of  nitroglycerin, call 9-1-1 immediately to seek emergency medical care. Do not take more than 3 nitroglycerin tablets over 15 minutes. If you take this medicine often to relieve symptoms of angina, your doctor or health care professional may provide you with different instructions to manage your symptoms. If symptoms do not go away after following these instructions, it is important to call 9-1-1 immediately. Do not take more than 3 nitroglycerin tablets over 15 minutes. Talk to your pediatrician regarding the use of this medicine in children. Special care may be needed. Overdosage: If you think you have taken too much of this medicine contact a poison control center or emergency room at once. NOTE: This medicine is only for you. Do not share this medicine with others. What if I miss a dose? This does not apply. This medicine is only used as needed. What may interact with this medicine? Do not take this medicine with any of the following medications:  certain migraine medicines like ergotamine and dihydroergotamine (DHE)  medicines used to treat erectile dysfunction like sildenafil, tadalafil, and vardenafil  riociguat This medicine may also interact with the following medications:  alteplase  aspirin  heparin  medicines for high blood pressure  medicines for mental depression  other  medicines used to treat angina  phenothiazines like chlorpromazine, mesoridazine, prochlorperazine, thioridazine This list may not describe all possible interactions. Give your health care provider a list of all the medicines, herbs, non-prescription drugs, or dietary supplements you use. Also tell them if you smoke, drink alcohol, or use illegal drugs. Some items may interact with your medicine. What should I watch for while using this medicine? Tell your doctor or health care professional if you feel your medicine is no longer working. Keep this medicine with you at all times. Sit or lie down when you take your medicine to prevent falling if you feel dizzy or faint after using it. Try to remain calm. This will help you to feel better faster. If you feel dizzy, take several deep breaths and lie down with your feet propped up, or bend forward with your head resting between your knees. You may get drowsy or dizzy. Do not drive, use machinery, or do anything that needs mental alertness until you know how this drug affects you. Do not stand or sit up quickly, especially if you are an older patient. This reduces the risk of dizzy or fainting spells. Alcohol can make you more drowsy and dizzy. Avoid alcoholic drinks. Do not treat yourself for coughs, colds, or pain while you are taking this medicine without asking your doctor or health care professional for advice. Some ingredients may increase your blood pressure. What side effects may I notice from receiving this medicine? Side effects that you should report to your doctor or health care professional as soon as possible:  blurred vision  dry mouth  skin rash  sweating  the feeling of extreme pressure in the head  unusually weak or tired Side effects that usually do not require medical attention (report to your doctor or health care professional if they continue or are bothersome):  flushing of the face or neck  headache  irregular  heartbeat, palpitations  nausea, vomiting This list may not describe all possible side effects. Call your doctor for medical advice about side effects. You may report side effects to FDA at 1-800-FDA-1088. Where should I keep my medicine? Keep out of the reach of children. Store at room temperature between 20 and 25 degrees C (68 and  77 degrees F). Store in Chief of Staff. Protect from light and moisture. Keep tightly closed. Throw away any unused medicine after the expiration date. NOTE: This sheet is a summary. It may not cover all possible information. If you have questions about this medicine, talk to your doctor, pharmacist, or health care provider.  2020 Elsevier/Gold Standard (2013-03-16 17:57:36)        Signed, Almyra Deforest, PA  09/02/2019 12:16 PM    [facility]

## 2019-08-31 NOTE — Telephone Encounter (Signed)
New Message   *STAT* If patient is at the pharmacy, call can be transferred to refill team.   1. Which medications need to be refilled? (please list name of each medication and dose if known) nitroGLYCERIN (NITROSTAT) 0.4 MG SL tablet  2. Which pharmacy/location (including street and city if local pharmacy) is medication to be sent to? [place], [place] - [place]  3. Do they need a 30 day or 90 day supply? 30 day with refills

## 2019-08-31 NOTE — Patient Instructions (Signed)
Medication Instructions:   start Aspirin 81 mg  One tablet daily    May use Nitroglycerin  0.4 mg   As needed for chest discomfort 00 See information  Below. *If you need a refill on your cardiac medications before your next appointment, please call your pharmacy*   Lab Work: CBC BMP today  If you have labs (blood work) drawn today and your tests are completely normal, you will receive your results only by: Marland Kitchen MyChart Message (if you have MyChart) OR . A paper copy in the mail If you have any lab test that is abnormal or we need to change your treatment, we will call you to review the results.   Testing/Procedures: Will be schedule at  [facility] - Cardiac  Cath Lab on September 05, 2019 Your physician has requested that you have a cardiac catheterization. Cardiac catheterization is used to diagnose and/or treat various heart conditions. Doctors may recommend this procedure for a number of different reasons. The most common reason is to evaluate chest pain. Chest pain can be a symptom of coronary artery disease (CAD), and cardiac catheterization can show whether plaque is narrowing or blocking your heart's arteries. This procedure is also used to evaluate the valves, as well as measure the blood flow and oxygen levels in different parts of your heart. For further information please visit [email or web address]. Please follow instruction sheet, as given.    Follow-Up: At [facility], you and your health needs are our priority.  As part of our continuing mission to provide you with exceptional heart care, we have created designated Provider Care Teams.  These Care Teams include your primary Cardiologist (physician) and Advanced Practice Providers (APPs -  Physician Assistants and Nurse Practitioners) who all work together to provide you with the care you need, when you need it.  We recommend signing up for the patient portal called "MyChart".  Sign up information is provided on this After Visit  Summary.  MyChart is used to connect with patients for Virtual Visits (Telemedicine).  Patients are able to view lab/test results, encounter notes, upcoming appointments, etc.  Non-urgent messages can be sent to your provider as well.   To learn more about what you can do with MyChart, go to [email or web address].    Your next appointment:   2 week(s)  The format for your next appointment:   In Person  Provider:   Buford Dresser, MD or Almyra Deforest PA   Other Instructions      [place] [address] Dept: [phone] Loc: [place]  08/31/2019  You are scheduled for a Cardiac Catheterization on Tuesday, April 6 with Dr. Glenetta Hew.  1. Please arrive at the [facility] (Main Entrance A) at [facility]: [address] [place], [place] [address] at 7:00 AM (This time is two hours before your procedure to ensure your preparation). Free valet parking service is available.   Special note: Every effort is made to have your procedure done on time. Please understand that emergencies sometimes delay scheduled procedures.  2. Diet: Do not eat solid foods after midnight.  The patient may have clear liquids until 5am upon the day of the procedure.  3. Labs: You will need to have blood drawn today  - CBC,BMP , . You do not need to be fasting.  on Friday  September 01, 2019  At 12:20 pm  - Go to  [place] then you will need to self quarantine until  April 6 ,2021   4. Medication instructions in preparation for your procedure:   Do not take Losartan the morning of procedure   On the morning of your procedure, take your Aspirin 81 mg and any morning medicines NOT listed above.  You may use sips of water.  5. Plan for one night stay--bring personal belongings. 6. Bring a current list of your medications and current insurance  cards. 7. You MUST have a responsible person to drive you home. 8. Someone MUST be with you the first 24 hours after you arrive home or your discharge will be delayed. 9. Please wear clothes that are easy to get on and off and wear slip-on shoes.  Thank you for allowing [place] to care for you!   -- [facility]    .Nitroglycerin sublingual tablets What is this medicine? NITROGLYCERIN (nye troe GLI ser in) is a type of vasodilator. It relaxes blood vessels, increasing the blood and oxygen supply to your heart. This medicine is used to relieve chest pain caused by angina. It is also used to prevent chest pain before activities like climbing stairs, going outdoors in cold weather, or sexual activity. This medicine may be used for other purposes; ask your health care provider or pharmacist if you have questions. COMMON BRAND NAME(S): Nitroquick, Nitrostat, Nitrotab What should I tell my health care provider before I take this medicine? They need to know if you have any of these conditions:  anemia  head injury, recent stroke, or bleeding in the brain  liver disease  previous heart attack  an unusual or allergic reaction to nitroglycerin, other medicines, foods, dyes, or preservatives  pregnant or trying to get pregnant  breast-feeding How should I use this medicine? Take this medicine by mouth as needed. At the first sign of an angina attack (chest pain or tightness) place one tablet under your tongue. You can also take this medicine 5 to 10 minutes before an event likely to produce chest pain. Follow the directions on the prescription label. Let the tablet dissolve under the tongue. Do not swallow whole. Replace the dose if you accidentally swallow it. It will help if your mouth is not dry. Saliva around the tablet will help it to dissolve more quickly. Do not eat or drink, smoke or chew tobacco while a tablet is dissolving. If you are not better within 5  minutes after taking ONE dose of nitroglycerin, call 9-1-1 immediately to seek emergency medical care. Do not take more than 3 nitroglycerin tablets over 15 minutes. If you take this medicine often to relieve symptoms of angina, your doctor or health care professional may provide you with different instructions to manage your symptoms. If symptoms do not go away after following these instructions, it is important to call 9-1-1 immediately. Do not take more than 3 nitroglycerin tablets over 15 minutes. Talk to your pediatrician regarding the use of this medicine in children. Special care may be needed. Overdosage: If you think you have taken too much of this medicine contact a poison control center or emergency room at once. NOTE: This medicine is only for you. Do not share this medicine with others. What if I miss a dose? This does not apply. This medicine is only used as needed. What may interact with this medicine? Do not take this medicine with any of the following medications:  certain migraine medicines like  ergotamine and dihydroergotamine (DHE)  medicines used to treat erectile dysfunction like sildenafil, tadalafil, and vardenafil  riociguat This medicine may also interact with the following medications:  alteplase  aspirin  heparin  medicines for high blood pressure  medicines for mental depression  other medicines used to treat angina  phenothiazines like chlorpromazine, mesoridazine, prochlorperazine, thioridazine This list may not describe all possible interactions. Give your health care provider a list of all the medicines, herbs, non-prescription drugs, or dietary supplements you use. Also tell them if you smoke, drink alcohol, or use illegal drugs. Some items may interact with your medicine. What should I watch for while using this medicine? Tell your doctor or health care professional if you feel your medicine is no longer working. Keep this medicine with you at all  times. Sit or lie down when you take your medicine to prevent falling if you feel dizzy or faint after using it. Try to remain calm. This will help you to feel better faster. If you feel dizzy, take several deep breaths and lie down with your feet propped up, or bend forward with your head resting between your knees. You may get drowsy or dizzy. Do not drive, use machinery, or do anything that needs mental alertness until you know how this drug affects you. Do not stand or sit up quickly, especially if you are an older patient. This reduces the risk of dizzy or fainting spells. Alcohol can make you more drowsy and dizzy. Avoid alcoholic drinks. Do not treat yourself for coughs, colds, or pain while you are taking this medicine without asking your doctor or health care professional for advice. Some ingredients may increase your blood pressure. What side effects may I notice from receiving this medicine? Side effects that you should report to your doctor or health care professional as soon as possible:  blurred vision  dry mouth  skin rash  sweating  the feeling of extreme pressure in the head  unusually weak or tired Side effects that usually do not require medical attention (report to your doctor or health care professional if they continue or are bothersome):  flushing of the face or neck  headache  irregular heartbeat, palpitations  nausea, vomiting This list may not describe all possible side effects. Call your doctor for medical advice about side effects. You may report side effects to FDA at 1-800-FDA-1088. Where should I keep my medicine? Keep out of the reach of children. Store at room temperature between 20 and 25 degrees C (68 and 77 degrees F). Store in [employer]. Protect from light and moisture. Keep tightly closed. Throw away any unused medicine after the expiration date. NOTE: This sheet is a summary. It may not cover all possible information. If you have questions  about this medicine, talk to your doctor, pharmacist, or health care provider.  2020 Elsevier/Gold Standard (2013-03-16 17:57:36)

## 2019-09-01 ENCOUNTER — Other Ambulatory Visit (HOSPITAL_COMMUNITY)
Admission: RE | Admit: 2019-09-01 | Discharge: 2019-09-01 | Disposition: A | Discharge status: Home / Self Care | Payer: 59 | Source: Ambulatory Visit | Attending: Cardiology | Admitting: Cardiology

## 2019-09-01 DIAGNOSIS — Z01812 Encounter for preprocedural laboratory examination: Secondary | ICD-10-CM | POA: Diagnosis not present

## 2019-09-01 DIAGNOSIS — Z20822 Contact with and (suspected) exposure to covid-19: Secondary | ICD-10-CM | POA: Diagnosis not present

## 2019-09-01 LAB — SARS CORONAVIRUS 2 (TAT 6-24 HRS): SARS Coronavirus 2: NEGATIVE

## 2019-09-01 MED ORDER — NITROGLYCERIN 0.4 MG SL SUBL: 0.4000 mg | SUBLINGUAL_TABLET | SUBLINGUAL | 5 refills | Status: DC | PRN

## 2019-09-01 NOTE — Progress Notes (Signed)
Normal red blood cell, stable kidney function, normal electrolyte

## 2019-09-02 ENCOUNTER — Other Ambulatory Visit: Payer: Self-pay | Admitting: Physician Assistant

## 2019-09-02 ENCOUNTER — Encounter: Payer: Self-pay | Admitting: Physician Assistant

## 2019-09-02 MED ORDER — SODIUM CHLORIDE 0.9% FLUSH: 3.0000 mL | Freq: Two times a day (BID) | INTRAVENOUS | Status: DC

## 2019-09-04 ENCOUNTER — Telehealth: Payer: Self-pay | Admitting: *Deleted

## 2019-09-04 MED FILL — HYDROCHLOROTHIAZIDE 12.5 MG: 12.5 | 90 days supply | Qty: 90 | Fill #0

## 2019-09-04 MED FILL — LOSARTAN POTASSIUM 50 MG TA: 50 | 90 days supply | Qty: 45 | Fill #0

## 2019-09-04 NOTE — Telephone Encounter (Signed)
Mailbox full, unable to leave message

## 2019-09-04 NOTE — Telephone Encounter (Signed)
Pt contacted pre-catheterization scheduled at [facility] for: Tuesday September 05, 2019 9 AM Verified arrival time and place: [place] [facility]) at: 7 AM   No solid food after midnight prior to cath, clear liquids until 5 AM day of procedure. Contrast allergy: no  AM meds can be  taken pre-cath with sip of water including: ASA 81 mg   Confirmed patient has responsible adult to drive home post procedure and observe 24 hours after arriving home:   Currently, due to Covid-19 pandemic, only one person will be allowed with patient. Must be the same person for patient's entire stay and will be required to wear a mask. They will be asked to wait in the waiting room for the duration of the patient's stay.  Patients are required to wear a mask when they enter the hospital.  Call to patient to review procedure instructions, received message at number listed that mailbox was full, unable to leave a message.

## 2019-09-05 ENCOUNTER — Encounter (HOSPITAL_COMMUNITY)
Admission: RE | Disposition: A | Discharge status: Home / Self Care | Payer: Self-pay | Source: Home / Self Care | Attending: Cardiology

## 2019-09-05 ENCOUNTER — Other Ambulatory Visit: Payer: Self-pay

## 2019-09-05 ENCOUNTER — Ambulatory Visit (HOSPITAL_COMMUNITY)
Admission: RE | Admit: 2019-09-05 | Discharge: 2019-09-05 | Disposition: A | Discharge status: Home / Self Care | Payer: 59 | Attending: Cardiology | Admitting: Cardiology

## 2019-09-05 DIAGNOSIS — I200 Unstable angina: Secondary | ICD-10-CM | POA: Diagnosis present

## 2019-09-05 DIAGNOSIS — E785 Hyperlipidemia, unspecified: Secondary | ICD-10-CM | POA: Insufficient documentation

## 2019-09-05 DIAGNOSIS — I10 Essential (primary) hypertension: Secondary | ICD-10-CM | POA: Insufficient documentation

## 2019-09-05 DIAGNOSIS — I25110 Atherosclerotic heart disease of native coronary artery with unstable angina pectoris: Secondary | ICD-10-CM | POA: Diagnosis not present

## 2019-09-05 DIAGNOSIS — Z91040 Latex allergy status: Secondary | ICD-10-CM | POA: Insufficient documentation

## 2019-09-05 DIAGNOSIS — Z79899 Other long term (current) drug therapy: Secondary | ICD-10-CM | POA: Insufficient documentation

## 2019-09-05 HISTORY — PX: LEFT HEART CATH AND CORONARY ANGIOGRAPHY: CATH118249

## 2019-09-05 SURGERY — LEFT HEART CATH AND CORONARY ANGIOGRAPHY
Anesthesia: LOCAL

## 2019-09-05 MED ORDER — VERAPAMIL HCL 2.5 MG/ML IV SOLN
INTRAVENOUS | Status: DC | PRN
  Administered 2019-09-05: 10 mL via INTRA_ARTERIAL

## 2019-09-05 MED ORDER — SODIUM CHLORIDE 0.9% FLUSH: 3.0000 mL | INTRAVENOUS | Status: DC | PRN

## 2019-09-05 MED ORDER — LIDOCAINE HCL (PF) 1 % IJ SOLN
INTRAMUSCULAR | Status: AC
  Filled 2019-09-05: qty 30

## 2019-09-05 MED ORDER — FENTANYL CITRATE (PF) 100 MCG/2ML IJ SOLN
INTRAMUSCULAR | Status: AC
  Filled 2019-09-05: qty 2

## 2019-09-05 MED ORDER — SODIUM CHLORIDE 0.9 % IV SOLN: 250.0000 mL | INTRAVENOUS | Status: DC | PRN

## 2019-09-05 MED ORDER — HEPARIN SODIUM (PORCINE) 1000 UNIT/ML IJ SOLN
INTRAMUSCULAR | Status: AC
  Filled 2019-09-05: qty 1

## 2019-09-05 MED ORDER — HEPARIN SODIUM (PORCINE) 1000 UNIT/ML IJ SOLN
INTRAMUSCULAR | Status: DC | PRN
  Administered 2019-09-05: 4000 [IU] via INTRAVENOUS

## 2019-09-05 MED ORDER — HEPARIN (PORCINE) IN NACL 1000-0.9 UT/500ML-% IV SOLN
INTRAVENOUS | Status: DC | PRN
  Administered 2019-09-05: 500 mL

## 2019-09-05 MED ORDER — ASPIRIN 81 MG PO CHEW
CHEWABLE_TABLET | ORAL | Status: AC
  Filled 2019-09-05: qty 1

## 2019-09-05 MED ORDER — MIDAZOLAM HCL 2 MG/2ML IJ SOLN
INTRAMUSCULAR | Status: DC | PRN
  Administered 2019-09-05: 2 mg via INTRAVENOUS

## 2019-09-05 MED ORDER — VERAPAMIL HCL 2.5 MG/ML IV SOLN
INTRAVENOUS | Status: AC
  Filled 2019-09-05: qty 2

## 2019-09-05 MED ORDER — HEPARIN (PORCINE) IN NACL 1000-0.9 UT/500ML-% IV SOLN
INTRAVENOUS | Status: AC
  Filled 2019-09-05: qty 1000

## 2019-09-05 MED ORDER — SODIUM CHLORIDE 0.9 % WEIGHT BASED INFUSION: 1.0000 mL/kg/h | INTRAVENOUS | Status: DC

## 2019-09-05 MED ORDER — SODIUM CHLORIDE 0.9 % WEIGHT BASED INFUSION
3.0000 mL/kg/h | INTRAVENOUS | Status: AC
  Administered 2019-09-05: 3 mL/kg/h via INTRAVENOUS

## 2019-09-05 MED ORDER — IOHEXOL 350 MG/ML SOLN
INTRAVENOUS | Status: DC | PRN
  Administered 2019-09-05: 10:00:00 90 mL

## 2019-09-05 MED ORDER — MIDAZOLAM HCL 2 MG/2ML IJ SOLN
INTRAMUSCULAR | Status: AC
  Filled 2019-09-05: qty 2

## 2019-09-05 MED ORDER — ASPIRIN EC 81 MG PO TBEC: 81.0000 mg | DELAYED_RELEASE_TABLET | Freq: Every day | ORAL | 4 refills | Status: DC

## 2019-09-05 MED ORDER — LIDOCAINE HCL (PF) 1 % IJ SOLN
INTRAMUSCULAR | Status: DC | PRN
  Administered 2019-09-05: 3 mL

## 2019-09-05 MED ORDER — ASPIRIN 81 MG PO CHEW
81.0000 mg | CHEWABLE_TABLET | ORAL | Status: AC
  Administered 2019-09-05: 81 mg via ORAL

## 2019-09-05 MED ORDER — FENTANYL CITRATE (PF) 100 MCG/2ML IJ SOLN
INTRAMUSCULAR | Status: DC | PRN
  Administered 2019-09-05: 25 ug via INTRAVENOUS

## 2019-09-05 MED FILL — ASPIRIN ADULT LOW STRENGTH: 81 | 90 days supply | Qty: 90 | Fill #0

## 2019-09-05 SURGICAL SUPPLY — 12 items
CATH INFINITI 5FR ANG PIGTAIL (CATHETERS) ×1 IMPLANT
CATH OPTITORQUE TIG 4.0 5F (CATHETERS) ×1 IMPLANT
DEVICE RAD COMP TR BAND LRG (VASCULAR PRODUCTS) ×1 IMPLANT
GLIDESHEATH SLEND SS 6F .021 (SHEATH) ×1 IMPLANT
GUIDEWIRE INQWIRE 1.5J.035X260 (WIRE) IMPLANT
INQWIRE 1.5J .035X260CM (WIRE) ×2
KIT HEART LEFT (KITS) ×2 IMPLANT
PACK CARDIAC CATHETERIZATION (CUSTOM PROCEDURE TRAY) ×2 IMPLANT
SHEATH PROBE COVER 6X72 (BAG) ×1 IMPLANT
SYR MEDRAD MARK 7 150ML (SYRINGE) ×2 IMPLANT
TRANSDUCER W/STOPCOCK (MISCELLANEOUS) ×2 IMPLANT
TUBING CIL FLEX 10 FLL-RA (TUBING) ×2 IMPLANT

## 2019-09-05 NOTE — Telephone Encounter (Signed)
Pt currently at hospital for procedure. 

## 2019-09-05 NOTE — Discharge Instructions (Signed)
Radial Site Care  This sheet gives you information about how to care for yourself after your procedure. Your health care provider may also give you more specific instructions. If you have problems or questions, contact your health care provider. What can I expect after the procedure? After the procedure, it is common to have:  Bruising and tenderness at the catheter insertion area. Follow these instructions at home: Medicines  Take over-the-counter and prescription medicines only as told by your health care provider. Insertion site care  Follow instructions from your health care provider about how to take care of your insertion site. Make sure you: ? Wash your hands with soap and water before you change your bandage (dressing). If soap and water are not available, use hand sanitizer. ? Change your dressing as told by your health care provider. ? Leave stitches (sutures), skin glue, or adhesive strips in place. These skin closures may need to stay in place for 2 weeks or longer. If adhesive strip edges start to loosen and curl up, you may trim the loose edges. Do not remove adhesive strips completely unless your health care provider tells you to do that.  Check your insertion site every day for signs of infection. Check for: ? Redness, swelling, or pain. ? Fluid or blood. ? Pus or a bad smell. ? Warmth.  Do not take baths, swim, or use a hot tub until your health care provider approves.  You may shower 24-48 hours after the procedure, or as directed by your health care provider. ? Remove the dressing and gently wash the site with plain soap and water. ? Pat the area dry with a clean towel. ? Do not rub the site. That could cause bleeding.  Do not apply powder or lotion to the site. Activity   For 24 hours after the procedure, or as directed by your health care provider: ? Do not flex or bend the affected arm. ? Do not push or pull heavy objects with the affected arm. ? Do not  drive yourself home from the hospital or clinic. You may drive 24 hours after the procedure unless your health care provider tells you not to. ? Do not operate machinery or power tools.  Do not lift anything that is heavier than 10 lb (4.5 kg), or the limit that you are told, until your health care provider says that it is safe.  Ask your health care provider when it is okay to: ? Return to work or school. ? Resume usual physical activities or sports. ? Resume sexual activity. General instructions  If the catheter site starts to bleed, raise your arm and put firm pressure on the site. If the bleeding does not stop, get help right away. This is a medical emergency.  If you went home on the same day as your procedure, a responsible adult should be with you for the first 24 hours after you arrive home.  Keep all follow-up visits as told by your health care provider. This is important. Contact a health care provider if:  You have a fever.  You have redness, swelling, or yellow drainage around your insertion site. Get help right away if:  You have unusual pain at the radial site.  The catheter insertion area swells very fast.  The insertion area is bleeding, and the bleeding does not stop when you hold steady pressure on the area.  Your arm or hand becomes pale, cool, tingly, or numb. These symptoms may represent a serious problem   that is an emergency. Do not wait to see if the symptoms will go away. Get medical help right away. Call your local emergency services (911 in the [place] Do not drive yourself to the hospital. Summary  After the procedure, it is common to have bruising and tenderness at the site.  Follow instructions from your health care provider about how to take care of your radial site wound. Check the wound every day for signs of infection.  Do not lift anything that is heavier than 10 lb (4.5 kg), or the limit that you are told, until your health care provider says  that it is safe. This information is not intended to replace advice given to you by your health care provider. Make sure you discuss any questions you have with your health care provider. Document Revised: 06/23/2017 Document Reviewed: 06/23/2017 Elsevier Patient Education  2020 Elsevier Inc.  

## 2019-09-05 NOTE — Interval H&P Note (Signed)
History and Physical Interval Note:  09/05/2019 9:25 AM  [name] [name]  has presented today for surgery, with the diagnosis of Progressive Angina.  The various methods of treatment have been discussed with the patient and family. After consideration of risks, benefits and other options for treatment, the patient has consented to  Procedure(s): LEFT HEART CATH AND CORONARY ANGIOGRAPHY (N/A)  PERCUTANEOUS CORONARY INTERVENTION  as a surgical intervention.  The patient's history has been reviewed, patient examined, no change in status, stable for surgery.  I have reviewed the patient's chart and labs.  Questions were answered to the patient's satisfaction.     Glenetta Hew

## 2019-09-07 ENCOUNTER — Telehealth: Payer: Self-pay

## 2019-09-07 NOTE — Telephone Encounter (Addendum)
Could not leave a voice message for the patient will send results with comment via MyChart and mail letter out.  ----- Message from [place], [place] sent at 09/01/2019  5:10 PM EDT ----- Normal red blood cell, stable kidney function, normal electrolyte

## 2019-09-09 ENCOUNTER — Other Ambulatory Visit: Payer: Self-pay

## 2019-09-09 ENCOUNTER — Ambulatory Visit: Payer: 59 | Attending: Internal Medicine

## 2019-09-09 DIAGNOSIS — Z23 Encounter for immunization: Secondary | ICD-10-CM

## 2019-09-09 NOTE — Progress Notes (Signed)
   Covid-19 Vaccination Clinic  Name:  [name] [name]    MRN: [id number] DOB: 02/10/1966  09/09/2019  Mr. [name] was observed post Covid-19 immunization for 15 minutes without incident. He was provided with Vaccine Information Sheet and instruction to access the V-Safe system.   Mr. [name] was instructed to call 911 with any severe reactions post vaccine: Marland Kitchen Difficulty breathing  . Swelling of face and throat  . A fast heartbeat  . A bad rash all over body  . Dizziness and weakness   Immunizations Administered    Name Date Dose VIS Date Route   Pfizer COVID-19 Vaccine 09/09/2019  3:48 PM 0.3 mL 05/12/2019 Intramuscular   Manufacturer: [place]   Lot: [id number]   [place]: [id number]

## 2019-09-22 DIAGNOSIS — Z23 Encounter for immunization: Secondary | ICD-10-CM | POA: Diagnosis not present

## 2019-09-22 DIAGNOSIS — Z0000 Encounter for general adult medical examination without abnormal findings: Secondary | ICD-10-CM | POA: Diagnosis not present

## 2019-09-22 DIAGNOSIS — I10 Essential (primary) hypertension: Secondary | ICD-10-CM | POA: Diagnosis not present

## 2019-09-22 DIAGNOSIS — R3989 Other symptoms and signs involving the genitourinary system: Secondary | ICD-10-CM | POA: Diagnosis not present

## 2019-09-22 DIAGNOSIS — Z125 Encounter for screening for malignant neoplasm of prostate: Secondary | ICD-10-CM | POA: Diagnosis not present

## 2019-09-22 DIAGNOSIS — I2510 Atherosclerotic heart disease of native coronary artery without angina pectoris: Secondary | ICD-10-CM | POA: Diagnosis not present

## 2019-09-22 DIAGNOSIS — Z79899 Other long term (current) drug therapy: Secondary | ICD-10-CM | POA: Diagnosis not present

## 2019-09-27 ENCOUNTER — Ambulatory Visit (INDEPENDENT_AMBULATORY_CARE_PROVIDER_SITE_OTHER): Payer: 59 | Admitting: Cardiology

## 2019-09-27 ENCOUNTER — Other Ambulatory Visit: Payer: Self-pay

## 2019-09-27 ENCOUNTER — Other Ambulatory Visit: Payer: Self-pay | Admitting: Cardiology

## 2019-09-27 ENCOUNTER — Encounter: Payer: Self-pay | Admitting: Cardiology

## 2019-09-27 VITALS — BP 124/72 | HR 88 | Temp 97.3°F | Ht 68.0 in | Wt 175.0 lb

## 2019-09-27 DIAGNOSIS — Z7189 Other specified counseling: Secondary | ICD-10-CM

## 2019-09-27 DIAGNOSIS — I10 Essential (primary) hypertension: Secondary | ICD-10-CM | POA: Diagnosis not present

## 2019-09-27 DIAGNOSIS — Z79899 Other long term (current) drug therapy: Secondary | ICD-10-CM | POA: Diagnosis not present

## 2019-09-27 DIAGNOSIS — E782 Mixed hyperlipidemia: Secondary | ICD-10-CM | POA: Diagnosis not present

## 2019-09-27 DIAGNOSIS — I2510 Atherosclerotic heart disease of native coronary artery without angina pectoris: Secondary | ICD-10-CM

## 2019-09-27 MED ORDER — ROSUVASTATIN CALCIUM 20 MG PO TABS: 20.0000 mg | ORAL_TABLET | Freq: Every day | ORAL | 3 refills | Status: DC

## 2019-09-27 MED FILL — ROSUVASTATIN CALCIUM 20 MG: 20 | 90 days supply | Qty: 90 | Fill #0

## 2019-09-27 NOTE — Patient Instructions (Signed)
Medication Instructions:  Start Rosuvastatin 20 mg daily  *If you need a refill on your cardiac medications before your next appointment, please call your pharmacy*   Lab Work: Your physician recommends that you return for lab work in 3 months ( fasting lipid, LFT)  If you have labs (blood work) drawn today and your tests are completely normal, you will receive your results only by: Marland Kitchen MyChart Message (if you have MyChart) OR . A paper copy in the mail If you have any lab test that is abnormal or we need to change your treatment, we will call you to review the results.   Testing/Procedures: None   Follow-Up: At [facility], you and your health needs are our priority.  As part of our continuing mission to provide you with exceptional heart care, we have created designated Provider Care Teams.  These Care Teams include your primary Cardiologist (physician) and Advanced Practice Providers (APPs -  Physician Assistants and Nurse Practitioners) who all work together to provide you with the care you need, when you need it.  We recommend signing up for the patient portal called "MyChart".  Sign up information is provided on this After Visit Summary.  MyChart is used to connect with patients for Virtual Visits (Telemedicine).  Patients are able to view lab/test results, encounter notes, upcoming appointments, etc.  Non-urgent messages can be sent to your provider as well.   To learn more about what you can do with MyChart, go to [email or web address].    Your next appointment:   1 year(s)  The format for your next appointment:   In Person  Provider:   Buford Dresser, MD

## 2019-09-27 NOTE — Progress Notes (Signed)
Cardiology Office Note:    Date:  09/27/2019   ID:  [name] [name], DOB 1965-09-03, MRN [id number]  PCP:  [name], MD  Cardiologist:  Buford Dresser, MD  Referring MD: [name], MD   CC: follow up  History of Present Illness:    [name] [name] is a 54 y.o. male with a hx of prior hypertension who is seen for follow up today. I initially saw 04/05/19 as a new consult at the request of [name], MD for the evaluation and management of chest tightness.  Today: Seen post cath. Reviewed results together. Has had two episodes of chest pain since cath. Has had BP meds adjusted, now on losartan 50 mg daily and no longer on HCTZ. Has had no episodes since being on this BP regimen. Does still have some fatigue intermittently. Breathing is good. Has occasional dizziness with rapidly changing position but no syncope. No PND, orthopnea, LE edema. Weights stable.   Past Medical History:  Diagnosis Date  . Hyperlipidemia   . Hypertension   . Liver abscess     Past Surgical History:  Procedure Laterality Date  . LEFT HEART CATH AND CORONARY ANGIOGRAPHY N/A 09/05/2019   Procedure: LEFT HEART CATH AND CORONARY ANGIOGRAPHY;  Surgeon: Leonie Man, MD;  Location: [facility];  Service: Cardiovascular;  Laterality: N/A;    Current Medications: Current Outpatient Medications on File Prior to Visit  Medication Sig  . aspirin EC 81 MG tablet Take 1 tablet (81 mg total) by mouth daily.  Marland Kitchen losartan (COZAAR) 50 MG tablet Take 50 mg by mouth daily.  . nitroGLYCERIN (NITROSTAT) 0.4 MG SL tablet Place 1 tablet (0.4 mg total) under the tongue every 5 (five) minutes as needed for chest pain.   Current Facility-Administered Medications on File Prior to Visit  Medication  . sodium chloride flush (NS) 0.9 % injection 3 mL     Allergies:   Lactose and Latex   Social History   Tobacco Use  . Smoking status: Never Smoker  . Smokeless tobacco: Never Used  Substance  Use Topics  . Alcohol use: No  . Drug use: No    Family History: family history includes Diabetes Mellitus II in his maternal grandmother.  ROS:   Please see the history of present illness.  Additional pertinent ROS otherwise unremarkable.     EKGs/Labs/Other Studies Reviewed:    The following studies were reviewed today: Heart cath 09/05/19  Prox RCA lesion is 50% stenosed.  Mid RCA to Dist RCA lesion is 35% stenosed.  Mid LAD lesion is 30% stenosed.  The left ventricular systolic function is normal.  LV end diastolic pressure is normal.  There is no aortic valve stenosis.    Mild to moderate two-vessel disease with most significant lesion being 50% smooth focal lesion in the proximal RCA, otherwise 30% distal RCA and 30% mid LAD  No potentially flow-limiting Culprit Lesion to explain angina  Normal LVEF and EDP.  ETT 05/02/19  Blood pressure demonstrated a normal response to exercise.  There was no ST segment deviation noted during stress.  Negative, adequate stress test.  Mildy impaired functional capacity.  Echo 2016 - Left ventricle: The cavity size was normal. There was mild  concentric hypertrophy. Systolic function was normal. The  estimated ejection fraction was in the range of 50% to 55%. Wall  motion was normal; there were no regional wall motion  abnormalities. Doppler parameters are consistent with abnormal  left ventricular relaxation (grade 1  diastolic dysfunction).  There was no evidence of elevated ventricular filling pressure by  Doppler parameters.  - Aortic valve: Trileaflet; normal thickness leaflets. There was no  regurgitation.  - Aortic root: The aortic root was normal in size.  - Mitral valve: Structurally normal valve. There was no  regurgitation.  - Right ventricle: Systolic function was normal.  - Right atrium: The atrium was normal in size.  - Tricuspid valve: There was trivial regurgitation.  - Pulmonary  arteries: Systolic pressure was within the normal  range.  - Inferior vena cava: The vessel was normal in size.  - Pericardium, extracardiac: There was no pericardial effusion.   Impressions:   - Normal biventricular size and systolic function.  Mild concentric LVH. Abnormal relaxation with abnormal filling  pressures.  No significant valvular abnormality.   EKG:  EKG is personally reviewed.  The ekg ordered 08/31/19 demonstrates NSR  Recent Labs: 08/31/2019: BUN 24; Creatinine, Ser 1.14; Hemoglobin 15.2; Platelets 304; Potassium 4.5; Sodium 142  Recent Lipid Panel    Component Value Date/Time   CHOL  11/06/2008 0915    102        ATP III CLASSIFICATION:  <200     mg/dL   Desirable  200-239  mg/dL   Borderline High  >=240    mg/dL   High          TRIG 58 11/06/2008 0915   HDL 12 (L) 11/06/2008 0915   CHOLHDL 8.5 11/06/2008 0915   VLDL 12 11/06/2008 0915   LDLCALC  11/06/2008 0915    78        Total Cholesterol/HDL:CHD Risk Coronary Heart Disease Risk Table                     Men   Women  1/2 Average Risk   3.4   3.3  Average Risk       5.0   4.4  2 X Average Risk   9.6   7.1  3 X Average Risk  23.4   11.0        Use the calculated Patient Ratio above and the CHD Risk Table to determine the patient's CHD Risk.        ATP III CLASSIFICATION (LDL):  <100     mg/dL   Optimal  100-129  mg/dL   Near or Above                    Optimal  130-159  mg/dL   Borderline  160-189  mg/dL   High  >190     mg/dL   Very High    Physical Exam:    VS:  BP 124/72 (BP Location: Left Arm, Patient Position: Sitting, Cuff Size: Normal)   Pulse 88   Temp (!) 97.3 F (36.3 C)   Ht 5\' 8"  (1.727 m)   Wt 175 lb (79.4 kg)   BMI 26.61 kg/m     Wt Readings from Last 3 Encounters:  09/27/19 175 lb (79.4 kg)  09/05/19 170 lb (77.1 kg)  08/31/19 176 lb 12.8 oz (80.2 kg)    GEN: Well nourished, well developed in no acute distress HEENT: Normal, moist mucous membranes NECK: No  JVD CARDIAC: regular rhythm, normal S1 and S2, no rubs or gallops. No murmur. VASCULAR: Radial and DP pulses 2+ bilaterally. No carotid bruits RESPIRATORY:  Clear to auscultation without rales, wheezing or rhonchi  ABDOMEN: Soft, non-tender, non-distended MUSCULOSKELETAL:  Ambulates independently SKIN: Warm  and dry, no edema NEUROLOGIC:  Alert and oriented x 3. No focal neuro deficits noted. PSYCHIATRIC:  Normal affect   ASSESSMENT:    1. Nonobstructive atherosclerosis of coronary artery   2. Medication management   3. Essential hypertension   4. Cardiac risk counseling   5. Counseling on health promotion and disease prevention   6. Mixed hyperlipidemia    PLAN:    Nonobstructive CAD:  -negative adequate stress test 05/2019 -due to continued intermittent chest pain, had heart cath 09/05/19 showing nonobstructive CAD -discussed cath results today.  -on aspirin 81 mg daily -discussed statin, below -has PRN SL NG ordered -discussed red flag warning signs that need immediate medical attention  Hyperlipidemia, mixed: -lipids 06/2018 show Tchol 178, HDL 45, LDL 92, TG 204 (unclear if fasting) -lipids 09/22/19: Tchol 185, HDL 44, LDL 92, TG 197 -with nonobstructive CAD, LDL goal <70 -discussed statins at length, recommendation based on CAD and guidelines. -after shared decision making, he will start rosuvastatin 20 mg -recheck lipids/LFTs ordered -counseled on diet and exercise  Hypertension: at goal of <130/80 today -tolerating losartan 50 mg with good control -didn't feel well on HCTZ additionally, ? Hypotension, will keep single antihypertensive for now  Cardiac risk counseling and prevention recommendations: -recommend heart healthy/Mediterranean diet, with whole grains, fruits, vegetable, fish, lean meats, nuts, and olive oil. Limit salt. -recommend moderate walking, 3-5 times/week for 30-50 minutes each session. Aim for at least 150 minutes.week. Goal should be pace of 3  miles/hours, or walking 1.5 miles in 30 minutes -recommend avoidance of tobacco products. Avoid excess alcohol.  Plan for follow up: one year or sooner as needed, labs ordered to monitor response to statin  Medication Adjustments/Labs and Tests Ordered: Current medicines are reviewed at length with the patient today.  Concerns regarding medicines are outlined above.  Orders Placed This Encounter  Procedures  . Lipid panel  . Hepatic function panel   Meds ordered this encounter  Medications  . rosuvastatin (CRESTOR) 20 MG tablet    Sig: Take 1 tablet (20 mg total) by mouth daily.    Dispense:  90 tablet    Refill:  3    Patient Instructions  Medication Instructions:  Start Rosuvastatin 20 mg daily  *If you need a refill on your cardiac medications before your next appointment, please call your pharmacy*   Lab Work: Your physician recommends that you return for lab work in 3 months ( fasting lipid, LFT)  If you have labs (blood work) drawn today and your tests are completely normal, you will receive your results only by: Marland Kitchen MyChart Message (if you have MyChart) OR . A paper copy in the mail If you have any lab test that is abnormal or we need to change your treatment, we will call you to review the results.   Testing/Procedures: None   Follow-Up: At [facility], you and your health needs are our priority.  As part of our continuing mission to provide you with exceptional heart care, we have created designated Provider Care Teams.  These Care Teams include your primary Cardiologist (physician) and Advanced Practice Providers (APPs -  Physician Assistants and Nurse Practitioners) who all work together to provide you with the care you need, when you need it.  We recommend signing up for the patient portal called "MyChart".  Sign up information is provided on this After Visit Summary.  MyChart is used to connect with patients for Virtual Visits (Telemedicine).  Patients are  able to view  lab/test results, encounter notes, upcoming appointments, etc.  Non-urgent messages can be sent to your provider as well.   To learn more about what you can do with MyChart, go to [email or web address].    Your next appointment:   1 year(s)  The format for your next appointment:   In Person  Provider:   Buford Dresser, MD       Signed, Buford Dresser, MD PhD 09/27/2019  [place]

## 2019-10-02 ENCOUNTER — Ambulatory Visit: Payer: 59 | Attending: Internal Medicine

## 2019-10-02 DIAGNOSIS — Z23 Encounter for immunization: Secondary | ICD-10-CM

## 2019-10-02 NOTE — Progress Notes (Signed)
   Covid-19 Vaccination Clinic  Name:  [name] [name]    MRN: [id number] DOB: [birth year]  10/02/2019  [name] was observed post Covid-19 immunization for 15 minutes without incident. He was provided with Vaccine Information Sheet and instruction to access the V-Safe system.   [name] was instructed to call 911 with any severe reactions post vaccine: Marland Kitchen Difficulty breathing  . Swelling of face and throat  . A fast heartbeat  . A bad rash all over body  . Dizziness and weakness   Immunizations Administered    Name Date Dose VIS Date Route   Pfizer COVID-19 Vaccine 10/02/2019  4:12 PM 0.3 mL 07/26/2018 Intramuscular   Manufacturer: [place]   Lot: [id number]   [place]: [id number]

## 2019-11-05 ENCOUNTER — Encounter: Payer: Self-pay | Admitting: Cardiology

## 2019-11-28 ENCOUNTER — Other Ambulatory Visit (HOSPITAL_BASED_OUTPATIENT_CLINIC_OR_DEPARTMENT_OTHER): Payer: Self-pay | Admitting: Family Medicine

## 2019-11-28 MED FILL — LOSARTAN POTASSIUM 50 MG TA: 50 | 90 days supply | Qty: 90 | Fill #0

## 2020-01-16 DIAGNOSIS — Z79899 Other long term (current) drug therapy: Secondary | ICD-10-CM | POA: Diagnosis not present

## 2020-01-16 LAB — HEPATIC FUNCTION PANEL
ALT: 30 IU/L (ref 0–44)
AST: 36 IU/L (ref 0–40)
Albumin: 4.4 g/dL (ref 3.8–4.9)
Alkaline Phosphatase: 50 IU/L (ref 48–121)
Bilirubin Total: 0.4 mg/dL (ref 0.0–1.2)
Bilirubin, Direct: 0.1 mg/dL (ref 0.00–0.40)
Total Protein: 6.4 g/dL (ref 6.0–8.5)

## 2020-01-16 LAB — LIPID PANEL
Chol/HDL Ratio: 2.5 ratio (ref 0.0–5.0)
Cholesterol, Total: 116 mg/dL (ref 100–199)
HDL: 46 mg/dL (ref >39)
LDL Chol Calc (NIH): 49 mg/dL (ref 0–99)
Triglycerides: 118 mg/dL (ref 0–149)
VLDL Cholesterol Cal: 21 mg/dL (ref 5–40)

## 2020-02-12 DIAGNOSIS — B829 Intestinal parasitism, unspecified: Secondary | ICD-10-CM | POA: Diagnosis not present

## 2020-03-01 ENCOUNTER — Telehealth: Payer: Self-pay

## 2020-03-01 NOTE — Telephone Encounter (Signed)
Spoke to pt. He report he continues to experience chest tightness 2-3 times a week and wanted to make MD aware. He voiced he hasn't taken Nitro because he is scared it will drop his BP. He report BP typically is around 130/90. Nurse educated pt and advised if he has chest discomfort, he is ok to take nitroglycerin with a systolic reading of 773'B. Pt voiced he is still concerned. Nurse offered an appointment but pt report he was under the impression he will continue to have this pain long term and has been managing it with rest. He voiced he is ust scared to take nitro.    Nurse advised pt he should be evaluated further but will route to MD for recommendations.

## 2020-03-03 NOTE — Telephone Encounter (Signed)
His cath did not show any significant blockages in the past. I agree with setting up appt to discuss further. Could be microvascular angina vs noncardiac cause, we could discuss this more at an appointment.

## 2020-03-04 NOTE — Telephone Encounter (Signed)
Pt agreeable with plan to schedule appointment and voiced he will check with his wife for a good date and time. Pt state he will call back.

## 2020-03-05 NOTE — Telephone Encounter (Signed)
Per chart review, appointment scheduled for 10/14.

## 2020-03-14 ENCOUNTER — Ambulatory Visit: Payer: 59 | Admitting: Cardiology

## 2020-03-14 ENCOUNTER — Encounter: Payer: Self-pay | Admitting: Cardiology

## 2020-03-14 ENCOUNTER — Other Ambulatory Visit: Payer: Self-pay

## 2020-03-14 VITALS — BP 124/70 | HR 64 | Ht 68.0 in | Wt 170.0 lb

## 2020-03-14 DIAGNOSIS — R0789 Other chest pain: Secondary | ICD-10-CM

## 2020-03-14 DIAGNOSIS — I10 Essential (primary) hypertension: Secondary | ICD-10-CM | POA: Diagnosis not present

## 2020-03-14 DIAGNOSIS — I2510 Atherosclerotic heart disease of native coronary artery without angina pectoris: Secondary | ICD-10-CM

## 2020-03-14 DIAGNOSIS — E782 Mixed hyperlipidemia: Secondary | ICD-10-CM

## 2020-03-14 DIAGNOSIS — Z7189 Other specified counseling: Secondary | ICD-10-CM | POA: Diagnosis not present

## 2020-03-14 NOTE — Progress Notes (Signed)
Cardiology Office Note:    Date:  03/14/2020   ID:  Drayke Grabel, DOB 12-Sep-1965, MRN [id number]  PCP:  [name], MD  Cardiologist:  Buford Dresser, MD  Referring MD: [name], MD   CC: follow up  History of Present Illness:    [name] [name] is a 54 y.o. male with a hx of prior hypertension who is seen for follow up today. I initially saw 04/05/19 as a new consult at the request of [name], MD for the evaluation and management of chest tightness.  Today: Continues to have chest pain. We reviewed cath and treadmill stress results again today. He had nonobstructive CAD on cath and no ischemic changes on treadmill.   He describes the chest pain as central pain, creeping up, tightness. Can start in the mid chest, goes up his throat.  Lasts 30 seconds-1 minute. Better with changing position, moving on his right side. Happened 3 times this week. Occurs at rest. Occasionally sweaty. Not clearly positional, related to food. Better with taking a deep breath.   Reports intermittent blood pressure elevations, though well controlled today. Takes 1/2 losartan twice daily, as he worries that his BP may be too low. Highest number was 170/105 several weeks ago, had a headache and chest tightness at that time.   ROS positive for leg cramps at night intermittently.   Denies shortness of breath at rest or with normal exertion. No PND, orthopnea, LE edema or unexpected weight gain. No syncope or palpitations.  Past Medical History:  Diagnosis Date  . Hyperlipidemia   . Hypertension   . Liver abscess     Past Surgical History:  Procedure Laterality Date  . LEFT HEART CATH AND CORONARY ANGIOGRAPHY N/A 09/05/2019   Procedure: LEFT HEART CATH AND CORONARY ANGIOGRAPHY;  Surgeon: Leonie Man, MD;  Location: [place] [facility];  Service: Cardiovascular;  Laterality: N/A;    Current Medications: Current Outpatient Medications on File Prior to Visit  Medication Sig    . aspirin EC 81 MG tablet Take 1 tablet (81 mg total) by mouth daily.  Marland Kitchen losartan (COZAAR) 50 MG tablet Take 50 mg by mouth daily.  . nitroGLYCERIN (NITROSTAT) 0.4 MG SL tablet Place 1 tablet (0.4 mg total) under the tongue every 5 (five) minutes as needed for chest pain.  . rosuvastatin (CRESTOR) 20 MG tablet Take 1 tablet (20 mg total) by mouth daily.   Current Facility-Administered Medications on File Prior to Visit  Medication  . sodium chloride flush (NS) 0.9 % injection 3 mL     Allergies:   Lactose and Latex   Social History   Tobacco Use  . Smoking status: Never Smoker  . Smokeless tobacco: Never Used  Substance Use Topics  . Alcohol use: No  . Drug use: No    Family History: family history includes Diabetes Mellitus II in his maternal grandmother.  ROS:   Please see the history of present illness.  Additional pertinent ROS otherwise unremarkable.     EKGs/Labs/Other Studies Reviewed:    The following studies were reviewed today: Heart cath 09/05/19  Prox RCA lesion is 50% stenosed.  Mid RCA to Dist RCA lesion is 35% stenosed.  Mid LAD lesion is 30% stenosed.  The left ventricular systolic function is normal.  LV end diastolic pressure is normal.  There is no aortic valve stenosis.    Mild to moderate two-vessel disease with most significant lesion being 50% smooth focal lesion in the proximal RCA, otherwise 30%  distal RCA and 30% mid LAD  No potentially flow-limiting Culprit Lesion to explain angina  Normal LVEF and EDP.  ETT 05/02/19  Blood pressure demonstrated a normal response to exercise.  There was no ST segment deviation noted during stress.  Negative, adequate stress test.  Mildy impaired functional capacity.  Echo 2016 - Left ventricle: The cavity size was normal. There was mild  concentric hypertrophy. Systolic function was normal. The  estimated ejection fraction was in the range of 50% to 55%. Wall  motion was normal; there  were no regional wall motion  abnormalities. Doppler parameters are consistent with abnormal  left ventricular relaxation (grade 1 diastolic dysfunction).  There was no evidence of elevated ventricular filling pressure by  Doppler parameters.  - Aortic valve: Trileaflet; normal thickness leaflets. There was no  regurgitation.  - Aortic root: The aortic root was normal in size.  - Mitral valve: Structurally normal valve. There was no  regurgitation.  - Right ventricle: Systolic function was normal.  - Right atrium: The atrium was normal in size.  - Tricuspid valve: There was trivial regurgitation.  - Pulmonary arteries: Systolic pressure was within the normal  range.  - Inferior vena cava: The vessel was normal in size.  - Pericardium, extracardiac: There was no pericardial effusion.   Impressions:   - Normal biventricular size and systolic function.  Mild concentric LVH. Abnormal relaxation with abnormal filling  pressures.  No significant valvular abnormality.   EKG:  EKG is personally reviewed.  The ekg ordered today demonstrates NSR, borderline LVH, HR 64 bpm  Recent Labs: 08/31/2019: BUN 24; Creatinine, Ser 1.14; Hemoglobin 15.2; Platelets 304; Potassium 4.5; Sodium 142 01/16/2020: ALT 30  Recent Lipid Panel    Component Value Date/Time   CHOL 116 01/16/2020 1103   TRIG 118 01/16/2020 1103   HDL 46 01/16/2020 1103   CHOLHDL 2.5 01/16/2020 1103   CHOLHDL 8.5 11/06/2008 0915   VLDL 12 11/06/2008 0915   LDLCALC 49 01/16/2020 1103    Physical Exam:    VS:  BP 124/70   Pulse 64   Ht 5\' 8"  (1.727 m)   Wt 170 lb (77.1 kg)   BMI 25.85 kg/m     Wt Readings from Last 3 Encounters:  03/14/20 170 lb (77.1 kg)  09/27/19 175 lb (79.4 kg)  09/05/19 170 lb (77.1 kg)    GEN: Well nourished, well developed in no acute distress HEENT: Normal, moist mucous membranes NECK: No JVD CARDIAC: regular rhythm, normal S1 and S2, no rubs or gallops. No  murmur. VASCULAR: Radial and DP pulses 2+ bilaterally. No carotid bruits RESPIRATORY:  Clear to auscultation without rales, wheezing or rhonchi  ABDOMEN: Soft, non-tender, non-distended MUSCULOSKELETAL:  Ambulates independently SKIN: Warm and dry, no edema NEUROLOGIC:  Alert and oriented x 3. No focal neuro deficits noted. PSYCHIATRIC:  Normal affect   ASSESSMENT:    1. Other chest pain   2. Nonobstructive atherosclerosis of coronary artery   3. Essential hypertension   4. Mixed hyperlipidemia   5. Counseling on health promotion and disease prevention   6. Cardiac risk counseling    PLAN:    Chest pain: high suspicion for noncardiac cause. Has had thorough cardiac evaluation without cause for his symptoms. Also improves with position changes or deep breathing, nonexertional -recommend he discuss non-cardiac causes with Dr. Stephanie Acre.  Nonobstructive CAD:  -negative adequate stress test 05/2019 -due to continued intermittent chest pain, had heart cath 09/05/19 showing nonobstructive CAD -we extensively reviewed  prior test results today -on aspirin 81 mg daily -statin as below -has PRN SL NG ordered -discussed red flag warning signs that need immediate medical attention  Hyperlipidemia, mixed: -lipids 06/2018 show Tchol 178, HDL 45, LDL 92, TG 204 (unclear if fasting) -lipids 09/22/19: Tchol 185, HDL 44, LDL 92, TG 197 -with nonobstructive CAD, LDL goal <70 -tolerating rosuvastatin 20 mg -repeat LDL 12/2019 49, TG 118  Hypertension: at goal of <130/80 today -he self-doses his losartan based on home BP readings. He prefers to split into 25 mg BID losartan to avoid low numbers -discussed goal readings  Cardiac risk counseling and prevention recommendations: -recommend heart healthy/Mediterranean diet, with whole grains, fruits, vegetable, fish, lean meats, nuts, and olive oil. Limit salt. -recommend moderate walking, 3-5 times/week for 30-50 minutes each session. Aim for at least  150 minutes.week. Goal should be pace of 3 miles/hours, or walking 1.5 miles in 30 minutes -recommend avoidance of tobacco products. Avoid excess alcohol.  Plan for follow up: one year or sooner as needed  Medication Adjustments/Labs and Tests Ordered: Current medicines are reviewed at length with the patient today.  Concerns regarding medicines are outlined above.  No orders of the defined types were placed in this encounter.  No orders of the defined types were placed in this encounter.   Patient Instructions  Medication Instructions:  Your Physician recommend you continue on your current medication as directed.    *If you need a refill on your cardiac medications before your next appointment, please call your pharmacy*   Lab Work: None ordered   Testing/Procedures: None ordered    Follow-Up: At [facility], you and your health needs are our priority.  As part of our continuing mission to provide you with exceptional heart care, we have created designated Provider Care Teams.  These Care Teams include your primary Cardiologist (physician) and Advanced Practice Providers (APPs -  Physician Assistants and Nurse Practitioners) who all work together to provide you with the care you need, when you need it.  We recommend signing up for the patient portal called "MyChart".  Sign up information is provided on this After Visit Summary.  MyChart is used to connect with patients for Virtual Visits (Telemedicine).  Patients are able to view lab/test results, encounter notes, upcoming appointments, etc.  Non-urgent messages can be sent to your provider as well.   To learn more about what you can do with MyChart, go to [email or web address].    Your next appointment:   1 year(s)  The format for your next appointment:   In Person  Provider:   Buford Dresser, MD     Signed, Buford Dresser, MD PhD 03/14/2020  [place]

## 2020-03-14 NOTE — Patient Instructions (Signed)

## 2020-03-19 NOTE — Addendum Note (Signed)
Addended by: Meryl Crutch on: 03/19/2020 02:49 PM   Modules accepted: Orders

## 2020-04-01 MED FILL — LOSARTAN POTASSIUM 50 MG TA: 50 | 90 days supply | Qty: 90 | Fill #1

## 2020-04-01 MED FILL — ROSUVASTATIN CALCIUM 20 MG: 20 | 90 days supply | Qty: 90 | Fill #1

## 2020-05-18 ENCOUNTER — Ambulatory Visit: Payer: 59 | Attending: Internal Medicine

## 2020-05-18 DIAGNOSIS — Z23 Encounter for immunization: Secondary | ICD-10-CM

## 2020-05-18 NOTE — Progress Notes (Signed)
   Covid-19 Vaccination Clinic  Name:  [name] [name]    MRN: [id number] DOB: 1966/01/16  05/18/2020  Mr. [name] was observed post Covid-19 immunization for 15 minutes without incident. He was provided with Vaccine Information Sheet and instruction to access the V-Safe system.   Mr. [name] was instructed to call 911 with any severe reactions post vaccine: Marland Kitchen Difficulty breathing  . Swelling of face and throat  . A fast heartbeat  . A bad rash all over body  . Dizziness and weakness   Immunizations Administered    Name Date Dose VIS Date Route   Pfizer COVID-19 Vaccine 05/18/2020 12:20 PM 0.3 mL 03/20/2020 Intramuscular   Manufacturer: [place]   Lot: [id number]   [place]: [id number]

## 2020-07-02 MED FILL — LOSARTAN POTASSIUM 50 MG TA: 50 | 90 days supply | Qty: 90 | Fill #2

## 2020-07-18 MED FILL — ROSUVASTATIN CALCIUM 20 MG: 20 | 90 days supply | Qty: 90 | Fill #2

## 2020-08-31 ENCOUNTER — Other Ambulatory Visit (HOSPITAL_BASED_OUTPATIENT_CLINIC_OR_DEPARTMENT_OTHER): Payer: Self-pay

## 2020-09-13 ENCOUNTER — Other Ambulatory Visit (HOSPITAL_BASED_OUTPATIENT_CLINIC_OR_DEPARTMENT_OTHER): Payer: Self-pay

## 2020-09-13 MED FILL — Losartan Potassium Tab 50 MG: ORAL | 90 days supply | Qty: 90 | Fill #0 | Status: AC

## 2020-09-19 ENCOUNTER — Other Ambulatory Visit (HOSPITAL_BASED_OUTPATIENT_CLINIC_OR_DEPARTMENT_OTHER): Payer: Self-pay

## 2020-09-19 ENCOUNTER — Other Ambulatory Visit: Payer: Self-pay | Admitting: Cardiology

## 2020-09-19 DIAGNOSIS — I2510 Atherosclerotic heart disease of native coronary artery without angina pectoris: Secondary | ICD-10-CM

## 2020-09-20 ENCOUNTER — Other Ambulatory Visit (HOSPITAL_BASED_OUTPATIENT_CLINIC_OR_DEPARTMENT_OTHER): Payer: Self-pay

## 2020-09-23 ENCOUNTER — Other Ambulatory Visit (HOSPITAL_BASED_OUTPATIENT_CLINIC_OR_DEPARTMENT_OTHER): Payer: Self-pay

## 2020-10-03 DIAGNOSIS — Z79899 Other long term (current) drug therapy: Secondary | ICD-10-CM | POA: Diagnosis not present

## 2020-10-03 DIAGNOSIS — Z0000 Encounter for general adult medical examination without abnormal findings: Secondary | ICD-10-CM | POA: Diagnosis not present

## 2020-10-03 DIAGNOSIS — I2510 Atherosclerotic heart disease of native coronary artery without angina pectoris: Secondary | ICD-10-CM | POA: Diagnosis not present

## 2020-10-03 DIAGNOSIS — I10 Essential (primary) hypertension: Secondary | ICD-10-CM | POA: Diagnosis not present

## 2020-10-03 DIAGNOSIS — Z23 Encounter for immunization: Secondary | ICD-10-CM | POA: Diagnosis not present

## 2020-12-15 ENCOUNTER — Other Ambulatory Visit (HOSPITAL_BASED_OUTPATIENT_CLINIC_OR_DEPARTMENT_OTHER): Payer: Self-pay

## 2020-12-16 ENCOUNTER — Other Ambulatory Visit (HOSPITAL_BASED_OUTPATIENT_CLINIC_OR_DEPARTMENT_OTHER): Payer: Self-pay

## 2020-12-16 MED ORDER — LOSARTAN POTASSIUM 50 MG PO TABS
ORAL_TABLET | ORAL | 4 refills | Status: AC
  Filled 2020-12-16 – 2020-12-27 (×2): qty 90, 90d supply, fill #0
  Filled 2021-03-27: qty 90, 90d supply, fill #1
  Filled 2021-07-16: qty 90, 90d supply, fill #2

## 2020-12-18 ENCOUNTER — Other Ambulatory Visit (HOSPITAL_BASED_OUTPATIENT_CLINIC_OR_DEPARTMENT_OTHER): Payer: Self-pay

## 2020-12-23 ENCOUNTER — Other Ambulatory Visit (HOSPITAL_BASED_OUTPATIENT_CLINIC_OR_DEPARTMENT_OTHER): Payer: Self-pay

## 2020-12-27 ENCOUNTER — Other Ambulatory Visit (HOSPITAL_BASED_OUTPATIENT_CLINIC_OR_DEPARTMENT_OTHER): Payer: Self-pay

## 2021-03-21 ENCOUNTER — Other Ambulatory Visit (HOSPITAL_BASED_OUTPATIENT_CLINIC_OR_DEPARTMENT_OTHER): Payer: Self-pay

## 2021-03-24 ENCOUNTER — Other Ambulatory Visit (HOSPITAL_BASED_OUTPATIENT_CLINIC_OR_DEPARTMENT_OTHER): Payer: Self-pay

## 2021-03-27 ENCOUNTER — Other Ambulatory Visit (HOSPITAL_BASED_OUTPATIENT_CLINIC_OR_DEPARTMENT_OTHER): Payer: Self-pay

## 2021-05-18 DIAGNOSIS — H2513 Age-related nuclear cataract, bilateral: Secondary | ICD-10-CM | POA: Diagnosis not present

## 2021-05-18 DIAGNOSIS — H43811 Vitreous degeneration, right eye: Secondary | ICD-10-CM | POA: Diagnosis not present

## 2021-05-18 DIAGNOSIS — H35033 Hypertensive retinopathy, bilateral: Secondary | ICD-10-CM | POA: Diagnosis not present

## 2021-05-18 DIAGNOSIS — H43822 Vitreomacular adhesion, left eye: Secondary | ICD-10-CM | POA: Diagnosis not present

## 2021-07-16 ENCOUNTER — Other Ambulatory Visit (HOSPITAL_BASED_OUTPATIENT_CLINIC_OR_DEPARTMENT_OTHER): Payer: Self-pay

## 2021-08-25 ENCOUNTER — Encounter: Payer: Self-pay | Admitting: Gastroenterology

## 2021-09-09 ENCOUNTER — Other Ambulatory Visit (HOSPITAL_BASED_OUTPATIENT_CLINIC_OR_DEPARTMENT_OTHER): Payer: Self-pay

## 2021-09-09 ENCOUNTER — Other Ambulatory Visit: Payer: Self-pay | Admitting: Cardiology

## 2021-09-09 MED ORDER — NITROGLYCERIN 0.4 MG SL SUBL
0.4000 mg | SUBLINGUAL_TABLET | SUBLINGUAL | 0 refills | Status: AC | PRN
  Filled 2021-09-09: qty 25, 1d supply, fill #0

## 2021-09-09 NOTE — Telephone Encounter (Signed)
Rx(s) sent to pharmacy electronically.  

## 2021-09-10 ENCOUNTER — Encounter: Payer: Self-pay | Admitting: Gastroenterology

## 2021-09-10 ENCOUNTER — Other Ambulatory Visit (HOSPITAL_BASED_OUTPATIENT_CLINIC_OR_DEPARTMENT_OTHER): Payer: Self-pay

## 2021-09-10 ENCOUNTER — Ambulatory Visit: Payer: 59 | Admitting: Gastroenterology

## 2021-09-10 VITALS — BP 144/80 | HR 80 | Ht 67.5 in | Wt 173.2 lb

## 2021-09-10 DIAGNOSIS — Z86010 Personal history of colonic polyps: Secondary | ICD-10-CM | POA: Diagnosis not present

## 2021-09-10 DIAGNOSIS — R143 Flatulence: Secondary | ICD-10-CM | POA: Insufficient documentation

## 2021-09-10 DIAGNOSIS — R140 Abdominal distension (gaseous): Secondary | ICD-10-CM | POA: Diagnosis not present

## 2021-09-10 MED ORDER — NA SULFATE-K SULFATE-MG SULF 17.5-3.13-1.6 GM/177ML PO SOLN
1.0000 | Freq: Once | ORAL | 0 refills | Status: AC
  Filled 2021-09-10: qty 354, 1d supply, fill #0

## 2021-09-10 NOTE — Patient Instructions (Addendum)
IBgard use as directed.  ? ?You have been scheduled for a colonoscopy. Please follow written instructions given to you at your visit today.  ?Please pick up your prep supplies at the pharmacy within the next 1-3 days. ?If you use inhalers (even only as needed), please bring them with you on the day of your procedure. ? ? ?If you are age 58 or younger, your body mass index should be between 19-25. Your Body mass index is 26.73 kg/m?Marland Kitchen If this is out of the aformentioned range listed, please consider follow up with your Primary Care Provider.  ? ?________________________________________________________ ? ?The [place] [facility] providers would like to encourage you to use [facility] to communicate with providers for non-urgent requests or questions.  Due to long hold times on the telephone, sending your provider a message by [facility] may be a faster and more efficient way to get a response.  Please allow 48 business hours for a response.  Please remember that this is for non-urgent requests.  ?_______________________________________________________ ? ?

## 2021-09-10 NOTE — Progress Notes (Signed)
? ? ? ?09/10/2021 ?[name] [name] ?[id number] ?01/30/1966 ? ? ?HISTORY OF PRESENT ILLNESS: This is a 56 year old male who is a patient of Dr. Lynne [name], known him only for colonoscopy in June 2011.  He is here to schedule another colonoscopy.  He says that he moves his bowels regularly without issues.  No rectal bleeding.  No focalized abdominal pain per se.  He does complain of a lot of gas/flatulence and bloating.  This has really been an issue more so over the past months.  He has been taking Tums and simethicone for this.  Reports discomfort due to gas pains and cramping, but nothing localized, focal, or persistent.  He really has not changed anything in his diet.  No new medications.  No change in appetite or unintentional weight loss.  Recent labs by his PCP through [facility] looked good. ? ?Colonoscopy June 2011 with Dr. Fuller [name] he had 4 polyps removed, 1 of which appeared to be a tubular adenoma and the other were hyperplastic polyps and polypoid fragments of benign colonic mucosa. ? ? ?Past Medical History:  ?Diagnosis Date  ? Diverticulosis   ? Hyperlipidemia   ? Hypertension   ? Liver abscess   ? ?Past Surgical History:  ?Procedure Laterality Date  ? ABSCESS DRAIN LIVER PERC ([place] [facility]X)    ? LEFT HEART CATH AND CORONARY ANGIOGRAPHY N/A 09/05/2019  ? Procedure: LEFT HEART CATH AND CORONARY ANGIOGRAPHY;  Surgeon: [name] Man, MD;  Location: [facility];  Service: Cardiovascular;  Laterality: N/A;  ? ? reports that he has never smoked. He has never used smokeless tobacco. He reports that he does not drink alcohol and does not use drugs. ?family history includes Diabetes Mellitus II in his maternal grandmother; Other in his mother. ?Allergies  ?Allergen Reactions  ? Haldol [Haloperidol]   ?  Other reaction(s): confused  ? Lactose   ? Latex   ? ? ?  ?Outpatient Encounter Medications as of 09/10/2021  ?Medication Sig  ? calcium carbonate (TUMS EX) 750 MG chewable tablet Chew 1-2 tablets by mouth as needed for  heartburn.  ? famotidine (PEPCID) 20 MG tablet Take 20 mg by mouth as needed for heartburn or indigestion.  ? ibuprofen (ADVIL) 200 MG tablet Take 1-2 tablets by mouth as needed.  ? losartan (COZAAR) 50 MG tablet Take 1/2 tablet by mouth twice a day (Patient taking differently: Take 50 mg by mouth daily.)  ? simethicone (GAS-X) 80 MG chewable tablet Chew 80-160 mg by mouth as needed for flatulence.  ? nitroGLYCERIN (NITROSTAT) 0.4 MG SL tablet Place 1 tablet (0.4 mg total) under the tongue every 5 (five) minutes as needed for chest pain. (Patient not taking: Reported on 09/10/2021)  ? [DISCONTINUED] aspirin EC 81 MG tablet Take 81 mg by mouth daily. 3-4 times a week  ? [DISCONTINUED] losartan (COZAAR) 50 MG tablet Take 25 mg by mouth in the morning and at bedtime.  ? [DISCONTINUED] losartan (COZAAR) 50 MG tablet TAKE 1/2 TABLET BY MOUTH TWO TIMES DAILY  ? [DISCONTINUED] rosuvastatin (CRESTOR) 20 MG tablet Take 20 mg by mouth daily. 3-4 times a week  ? ?Facility-Administered Encounter Medications as of 09/10/2021  ?Medication  ? sodium chloride flush (NS) 0.9 % injection 3 mL  ? ? ? ?REVIEW OF SYSTEMS  : All other systems reviewed and negative except where noted in the History of Present Illness. ? ? ?PHYSICAL EXAM: ?BP (!) 144/80 (BP Location: Left Arm, Patient Position: Sitting, Cuff Size: Normal)  Pulse 80   Ht 5' 7.5" (1.715 m) Comment: height measured without shoes  Wt 173 lb 4 oz (78.6 kg)   BMI 26.73 kg/m?  ?General: Well developed male in no acute distress ?Head: Normocephalic and atraumatic ?Eyes:  Sclerae anicteric, conjunctiva pink. ?Ears: Normal auditory acuity ?Lungs: Clear throughout to auscultation; no W/R/R. ?Heart: Regular rate and rhythm; no M/R/G. ?Abdomen: Soft, non-distended.  BS present.  Non-tender. ?Rectal:  Will be done at the time of colonoscopy. ?Musculoskeletal: Symmetrical with no gross deformities  ?Skin: No lesions on visible extremities ?Extremities: No edema  ?Neurological: Alert  oriented x 4, grossly non-focal ?Psychological:  Alert and cooperative. Normal mood and affect ? ?ASSESSMENT AND [name]: ?*Personal history of colon polyps: Had tubular adenoma in 2011. We will schedule for colonoscopy with Dr. Fuller [name]. ?*Gas/bloating: No other associated GI symptoms.  Suspect dietary related although he has not had any major changes in his diet.  We will [name] for colonoscopy as above.  We discussed complete lactose-free diet for couple weeks and we also discussed FODMAP diet which he was given literature on.  Can try IBgard.  Samples were given. ? ?**The risks, benefits, and alternatives to colonoscopy were discussed with the patient and he consents to proceed.  ? ?CC:  [name] Jordan, MD ? ?  ?

## 2021-10-06 ENCOUNTER — Other Ambulatory Visit (HOSPITAL_BASED_OUTPATIENT_CLINIC_OR_DEPARTMENT_OTHER): Payer: Self-pay

## 2021-10-06 DIAGNOSIS — Z0000 Encounter for general adult medical examination without abnormal findings: Secondary | ICD-10-CM | POA: Diagnosis not present

## 2021-10-06 DIAGNOSIS — I10 Essential (primary) hypertension: Secondary | ICD-10-CM | POA: Diagnosis not present

## 2021-10-06 DIAGNOSIS — I2510 Atherosclerotic heart disease of native coronary artery without angina pectoris: Secondary | ICD-10-CM | POA: Diagnosis not present

## 2021-10-06 DIAGNOSIS — Z125 Encounter for screening for malignant neoplasm of prostate: Secondary | ICD-10-CM | POA: Diagnosis not present

## 2021-10-06 DIAGNOSIS — Z79899 Other long term (current) drug therapy: Secondary | ICD-10-CM | POA: Diagnosis not present

## 2021-10-06 DIAGNOSIS — F430 Acute stress reaction: Secondary | ICD-10-CM | POA: Diagnosis not present

## 2021-10-06 MED ORDER — VALSARTAN 80 MG PO TABS
ORAL_TABLET | ORAL | 4 refills | Status: DC
  Filled 2021-10-06 – 2021-10-21 (×2): qty 90, 90d supply, fill #0
  Filled 2022-01-28: qty 90, 90d supply, fill #1
  Filled 2022-04-28: qty 90, 90d supply, fill #2
  Filled 2022-07-23: qty 90, 90d supply, fill #3

## 2021-10-13 ENCOUNTER — Other Ambulatory Visit (HOSPITAL_BASED_OUTPATIENT_CLINIC_OR_DEPARTMENT_OTHER): Payer: Self-pay

## 2021-10-21 ENCOUNTER — Other Ambulatory Visit (HOSPITAL_BASED_OUTPATIENT_CLINIC_OR_DEPARTMENT_OTHER): Payer: Self-pay

## 2021-10-22 ENCOUNTER — Encounter: Payer: Self-pay | Admitting: Gastroenterology

## 2021-10-27 ENCOUNTER — Encounter: Payer: Self-pay | Admitting: Certified Registered Nurse Anesthetist

## 2021-11-03 ENCOUNTER — Telehealth: Payer: Self-pay

## 2021-11-03 ENCOUNTER — Ambulatory Visit: Payer: 59 | Admitting: Gastroenterology

## 2021-11-03 ENCOUNTER — Encounter: Payer: Self-pay | Admitting: General Practice

## 2021-11-03 DIAGNOSIS — Z86010 Personal history of colonic polyps: Secondary | ICD-10-CM

## 2021-11-03 NOTE — Telephone Encounter (Signed)
Pt was scheduled for colonoscopy on 11/03/21 with Dr. Fuller Plan. Pt needed to be rescheduled due to poor prep per provider. Plenvu sample given to patient, and went over instructions in [place].

## 2021-11-03 NOTE — Progress Notes (Unsigned)
Pt with dark brown stools . MD made aware, pt canceled and rescheduled for 0800 11/04/2021 with Lyndel Safe

## 2021-11-04 ENCOUNTER — Ambulatory Visit (AMBULATORY_SURGERY_CENTER): Payer: 59 | Admitting: Gastroenterology

## 2021-11-04 ENCOUNTER — Encounter: Payer: 59 | Admitting: Gastroenterology

## 2021-11-04 ENCOUNTER — Encounter: Payer: Self-pay | Admitting: Gastroenterology

## 2021-11-04 VITALS — BP 102/75 | HR 88 | Temp 96.8°F | Resp 17 | Ht 67.0 in | Wt 173.0 lb

## 2021-11-04 DIAGNOSIS — D123 Benign neoplasm of transverse colon: Secondary | ICD-10-CM | POA: Diagnosis not present

## 2021-11-04 DIAGNOSIS — Z09 Encounter for follow-up examination after completed treatment for conditions other than malignant neoplasm: Secondary | ICD-10-CM

## 2021-11-04 DIAGNOSIS — Z1211 Encounter for screening for malignant neoplasm of colon: Secondary | ICD-10-CM | POA: Diagnosis not present

## 2021-11-04 DIAGNOSIS — Z86010 Personal history of colonic polyps: Secondary | ICD-10-CM

## 2021-11-04 MED ORDER — SODIUM CHLORIDE 0.9 % IV SOLN: 500.0000 mL | Freq: Once | INTRAVENOUS | Status: DC

## 2021-11-04 NOTE — Progress Notes (Signed)
Pt non-responsive, VVS, Report to RN  °

## 2021-11-04 NOTE — Progress Notes (Signed)
[facility] History and Physical   Primary Care Physician:  [name], MD   Reason for Procedure:   H/O polyps   Plan:    colon     HPI: [name] [name] is a 56 y.o. male    Past Medical History:  Diagnosis Date   Diverticulosis    Hyperlipidemia    Hypertension    Liver abscess     Past Surgical History:  Procedure Laterality Date   ABSCESS DRAIN LIVER PERC ([place] HX)     LEFT HEART CATH AND CORONARY ANGIOGRAPHY N/A 09/05/2019   Procedure: LEFT HEART CATH AND CORONARY ANGIOGRAPHY;  Surgeon: Leonie Man, MD;  Location: [facility];  Service: Cardiovascular;  Laterality: N/A;    Prior to Admission medications   Medication Sig Start Date End Date Taking? Authorizing Provider  calcium carbonate (TUMS EX) 750 MG chewable tablet Chew 1-2 tablets by mouth as needed for heartburn.   Yes [provider]  famotidine (PEPCID) 20 MG tablet Take 20 mg by mouth as needed for heartburn or indigestion.   Yes [provider]  simethicone (MYLICON) 80 MG chewable tablet Chew 80-160 mg by mouth as needed for flatulence.   Yes [provider]  valsartan (DIOVAN) 80 MG tablet Take 1 tablet by mouth once a day 10/06/21  Yes   ibuprofen (ADVIL) 200 MG tablet Take 1-2 tablets by mouth as needed.    [provider]  losartan (COZAAR) 50 MG tablet Take 1/2 tablet by mouth twice a day Patient taking differently: Take 50 mg by mouth daily. 12/16/20     nitroGLYCERIN (NITROSTAT) 0.4 MG SL tablet Place 1 tablet (0.4 mg total) under the tongue every 5 (five) minutes as needed for chest pain. Patient not taking: Reported on 09/10/2021 09/09/21 12/08/21  Buford Dresser, MD    Current Outpatient Medications  Medication Sig Dispense Refill   calcium carbonate (TUMS EX) 750 MG chewable tablet Chew 1-2 tablets by mouth as needed for heartburn.     famotidine (PEPCID) 20 MG tablet Take 20 mg by mouth as needed for heartburn or indigestion.      simethicone (MYLICON) 80 MG chewable tablet Chew 80-160 mg by mouth as needed for flatulence.     valsartan (DIOVAN) 80 MG tablet Take 1 tablet by mouth once a day 90 tablet 4   ibuprofen (ADVIL) 200 MG tablet Take 1-2 tablets by mouth as needed.     losartan (COZAAR) 50 MG tablet Take 1/2 tablet by mouth twice a day (Patient taking differently: Take 50 mg by mouth daily.) 90 tablet 4   nitroGLYCERIN (NITROSTAT) 0.4 MG SL tablet Place 1 tablet (0.4 mg total) under the tongue every 5 (five) minutes as needed for chest pain. (Patient not taking: Reported on 09/10/2021) 25 tablet 0   Current Facility-Administered Medications  Medication Dose Route Frequency Provider Last Rate Last Admin   0.9 %  sodium chloride infusion  500 mL Intravenous Once Jackquline Denmark, MD        Allergies as of 11/04/2021 - Review Complete 11/04/2021  Allergen Reaction Noted   Haldol [haloperidol]  10/15/2020   Lactose  11/01/2010   Latex      Family History  Problem Relation Age of Onset   Other Mother        blood disorder   Diabetes Mellitus II Maternal Grandmother    Colon cancer Neg Hx    Esophageal cancer Neg Hx    Rectal cancer Neg Hx  Stomach cancer Neg Hx     Social History   Socioeconomic History   Marital status: Married    Spouse name: Not on file   Number of children: 2   Years of education: Not on file   Highest education level: Not on file  Occupational History   Not on file  Tobacco Use   Smoking status: Never   Smokeless tobacco: Never  Vaping Use   Vaping Use: Never used  Substance and Sexual Activity   Alcohol use: No   Drug use: No   Sexual activity: Not on file  Other Topics Concern   Not on file  Social History Narrative   Not on file   Social Determinants of Health   Financial Resource Strain: Not on file  Food Insecurity: Not on file  Transportation Needs: Not on file  Physical Activity: Not on file  Stress: Not on file  Social Connections: Not on file   Intimate Partner Violence: Not on file    Review of Systems: Positive for none All other review of systems negative except as mentioned in the HPI.  Physical Exam: Vital signs in last 24 hours: '@VSRANGES'$ @   General:   Alert,  Well-developed, well-nourished, pleasant and cooperative in NAD Lungs:  Clear throughout to auscultation.   Heart:  Regular rate and rhythm; no murmurs, clicks, rubs,  or gallops. Abdomen:  Soft, nontender and nondistended. Normal bowel sounds.   Neuro/Psych:  Alert and cooperative. Normal mood and affect. A and O x 3    No significant changes were identified.  The patient continues to be an appropriate candidate for the planned procedure and anesthesia.   Carmell Austria, MD. [facility] 11/04/2021 8:23 AM@

## 2021-11-04 NOTE — Op Note (Signed)
[facility] Patient Name: [name] [name] Procedure Date: 11/04/2021 8:02 AM MRN: [id number] Endoscopist: Jackquline Denmark , MD Age: 56 Referring MD:  Date of Birth: Oct 27, 1965 Gender: Male Account #: [id number] Procedure:                Colonoscopy Indications:              High risk colon cancer surveillance: Personal                            history of colonic polyps (pt came for his                            colonoscopy yesterday-due to poor prep, he was                            given extra prep and scheduled for this morning) Medicines:                Monitored Anesthesia Care Procedure:                Pre-Anesthesia Assessment:                           - Prior to the procedure, a History and Physical                            was performed, and patient medications and                            allergies were reviewed. The patient's tolerance of                            previous anesthesia was also reviewed. The risks                            and benefits of the procedure and the sedation                            options and risks were discussed with the patient.                            All questions were answered, and informed consent                            was obtained. Prior Anticoagulants: The patient has                            taken no previous anticoagulant or antiplatelet                            agents. ASA Grade Assessment: II - A patient with                            mild systemic disease. After reviewing the risks  and benefits, the patient was deemed in                            satisfactory condition to undergo the procedure.                           After obtaining informed consent, the colonoscope                            was passed under direct vision. Throughout the                            procedure, the patient's blood pressure, pulse, and                            oxygen saturations were  monitored continuously. The                            PCF-HQ190L Colonoscope was introduced through the                            anus and advanced to the 2 cm into the ileum. The                            colonoscopy was performed without difficulty. The                            patient tolerated the procedure well. The quality                            of the bowel preparation was good. The terminal                            ileum, ileocecal valve, appendiceal orifice, and                            rectum were photographed. Scope In: 8:26:01 AM Scope Out: 8:39:42 AM Scope Withdrawal Time: 0 hours 10 minutes 58 seconds  Total Procedure Duration: 0 hours 13 minutes 41 seconds  Findings:                 Two sessile polyps were found in the proximal                            transverse colon and mid transverse colon. The                            polyps were 4 to 6 mm in size. These polyps were                            removed with a cold snare. Resection and retrieval                            were complete.  A few small-mouthed diverticula were found in the                            sigmoid colon.                           The terminal ileum appeared normal.                           The exam was otherwise without abnormality on                            direct and retroflexion views. Complications:            No immediate complications. Estimated Blood Loss:     Estimated blood loss: none. Impression:               - Two 4 to 6 mm polyps in the proximal transverse                            colon and in the mid transverse colon, removed with                            a cold snare. Resected and retrieved.                           - Mild sigmoid diverticulosis.                           - The examined portion of the ileum was normal.                           - The examination was otherwise normal on direct                            and  retroflexion views. Recommendation:           - Patient has a contact number available for                            emergencies. The signs and symptoms of potential                            delayed complications were discussed with the                            patient. Return to normal activities tomorrow.                            Written discharge instructions were provided to the                            patient.                           - Resume previous diet.                           -  Continue present medications.                           - Await pathology results.                           - Repeat colonoscopy for surveillance based on                            pathology results.                           - The findings and recommendations were discussed                            with the patient's family. Jackquline Denmark, MD 11/04/2021 8:43:35 AM This report has been signed electronically.

## 2021-11-04 NOTE — Patient Instructions (Signed)
Handout on polyps provided   Await pathology results.   Continue current medications.   YOU HAD AN ENDOSCOPIC PROCEDURE TODAY AT [facility]:   Refer to the procedure report that was given to you for any specific questions about what was found during the examination.  If the procedure report does not answer your questions, please call your gastroenterologist to clarify.  If you requested that your care partner not be given the details of your procedure findings, then the procedure report has been included in a sealed envelope for you to review at your convenience later.  YOU SHOULD EXPECT: Some feelings of bloating in the abdomen. Passage of more gas than usual.  Walking can help get rid of the air that was put into your GI tract during the procedure and reduce the bloating. If you had a lower endoscopy (such as a colonoscopy or flexible sigmoidoscopy) you may notice spotting of blood in your stool or on the toilet paper. If you underwent a bowel prep for your procedure, you may not have a normal bowel movement for a few days.  Please Note:  You might notice some irritation and congestion in your nose or some drainage.  This is from the oxygen used during your procedure.  There is no need for concern and it should clear up in a day or so.  SYMPTOMS TO REPORT IMMEDIATELY:  Following lower endoscopy (colonoscopy or flexible sigmoidoscopy):  Excessive amounts of blood in the stool  Significant tenderness or worsening of abdominal pains  Swelling of the abdomen that is new, acute  Fever of 100F or higher  For urgent or emergent issues, a gastroenterologist can be reached at any hour by calling [phone]. Do not use MyChart messaging for urgent concerns.    DIET:  We do recommend a small meal at first, but then you may proceed to your regular diet.  Drink plenty of fluids but you should avoid alcoholic beverages for 24 hours.  ACTIVITY:  You should plan to take it easy for  the rest of today and you should NOT DRIVE or use heavy machinery until tomorrow (because of the sedation medicines used during the test).    FOLLOW UP: Our staff will call the number listed on your records 24-72 hours following your procedure to check on you and address any questions or concerns that you may have regarding the information given to you following your procedure. If we do not reach you, we will leave a message.  We will attempt to reach you two times.  During this call, we will ask if you have developed any symptoms of COVID 19. If you develop any symptoms (ie: fever, flu-like symptoms, shortness of breath, cough etc.) before then, please call [phone].  If you test positive for Covid 19 in the 2 weeks post procedure, please call and report this information to [place].    If any biopsies were taken you will be contacted by phone or by letter within the next 1-3 weeks.  Please call us at [phone] if you have not heard about the biopsies in 3 weeks.    SIGNATURES/CONFIDENTIALITY: You and/or your care partner have signed paperwork which will be entered into your electronic medical record.  These signatures attest to the fact that that the information above on your After Visit Summary has been reviewed and is understood.  Full responsibility of the confidentiality of this discharge information lies with you and/or your care-partner.

## 2021-11-04 NOTE — Progress Notes (Signed)
Called to room to assist during endoscopic procedure.  Patient ID and intended procedure confirmed with present staff. Received instructions for my participation in the procedure from the performing physician.  

## 2021-11-05 ENCOUNTER — Telehealth: Payer: Self-pay

## 2021-11-05 NOTE — Telephone Encounter (Signed)
No answer on second follow up call attempt. Voicemail full.

## 2021-11-05 NOTE — Telephone Encounter (Signed)
No answer on first follow up call. Voicemail full.

## 2021-11-09 ENCOUNTER — Encounter: Payer: Self-pay | Admitting: Gastroenterology

## 2022-01-09 DIAGNOSIS — S838X2A Sprain of other specified parts of left knee, initial encounter: Secondary | ICD-10-CM | POA: Diagnosis not present

## 2022-01-11 DIAGNOSIS — H524 Presbyopia: Secondary | ICD-10-CM | POA: Diagnosis not present

## 2022-01-28 ENCOUNTER — Other Ambulatory Visit (HOSPITAL_BASED_OUTPATIENT_CLINIC_OR_DEPARTMENT_OTHER): Payer: Self-pay

## 2022-03-02 DIAGNOSIS — D1801 Hemangioma of skin and subcutaneous tissue: Secondary | ICD-10-CM | POA: Diagnosis not present

## 2022-03-02 DIAGNOSIS — L738 Other specified follicular disorders: Secondary | ICD-10-CM | POA: Diagnosis not present

## 2022-03-02 DIAGNOSIS — L659 Nonscarring hair loss, unspecified: Secondary | ICD-10-CM | POA: Diagnosis not present

## 2022-03-10 DIAGNOSIS — Z23 Encounter for immunization: Secondary | ICD-10-CM | POA: Diagnosis not present

## 2022-03-10 DIAGNOSIS — R252 Cramp and spasm: Secondary | ICD-10-CM | POA: Diagnosis not present

## 2022-04-28 ENCOUNTER — Other Ambulatory Visit (HOSPITAL_BASED_OUTPATIENT_CLINIC_OR_DEPARTMENT_OTHER): Payer: Self-pay

## 2022-07-09 ENCOUNTER — Other Ambulatory Visit (HOSPITAL_BASED_OUTPATIENT_CLINIC_OR_DEPARTMENT_OTHER): Payer: Self-pay

## 2022-07-09 ENCOUNTER — Ambulatory Visit
Admission: EM | Admit: 2022-07-09 | Discharge: 2022-07-09 | Disposition: A | Discharge status: Home / Self Care | Payer: Commercial Managed Care - PPO | Attending: Urgent Care | Admitting: Urgent Care

## 2022-07-09 DIAGNOSIS — Z1152 Encounter for screening for COVID-19: Secondary | ICD-10-CM | POA: Diagnosis not present

## 2022-07-09 DIAGNOSIS — R070 Pain in throat: Secondary | ICD-10-CM

## 2022-07-09 DIAGNOSIS — B349 Viral infection, unspecified: Secondary | ICD-10-CM | POA: Diagnosis not present

## 2022-07-09 MED ORDER — BENZONATATE 100 MG PO CAPS
100.0000 mg | ORAL_CAPSULE | Freq: Three times a day (TID) | ORAL | 0 refills | Status: AC | PRN
  Filled 2022-07-09: qty 30, 10d supply, fill #0

## 2022-07-09 MED ORDER — PSEUDOEPHEDRINE HCL 60 MG PO TABS
60.0000 mg | ORAL_TABLET | Freq: Three times a day (TID) | ORAL | 0 refills | Status: AC | PRN
  Filled 2022-07-09: qty 30, 10d supply, fill #0

## 2022-07-09 MED ORDER — CETIRIZINE HCL 10 MG PO TABS
10.0000 mg | ORAL_TABLET | Freq: Every day | ORAL | 0 refills | Status: AC
  Filled 2022-07-09: qty 100, 90d supply, fill #0

## 2022-07-09 MED ORDER — PROMETHAZINE-DM 6.25-15 MG/5ML PO SYRP
5.0000 mL | ORAL_SOLUTION | Freq: Three times a day (TID) | ORAL | 0 refills | Status: AC | PRN
  Filled 2022-07-09: qty 200, 14d supply, fill #0

## 2022-07-09 NOTE — ED Triage Notes (Signed)
Pt c/o cough, congestion, fever, chills x 3 days-NAD-steady gait

## 2022-07-09 NOTE — Discharge Instructions (Signed)
We will notify you of your test results as they arrive and may take between about 24 hours.  I encourage you to sign up for MyChart if you have not already done so as this can be the easiest way for us to communicate results to you online or through a phone app.  Generally, we only contact you if it is a positive test result.  In the meantime, if you develop worsening symptoms including fever, chest pain, shortness of breath despite our current treatment plan then please report to the emergency room as this may be a sign of worsening status from possible viral infection.  Otherwise, we will manage this as a viral syndrome. For sore throat or cough try using a honey-based tea. Use 3 teaspoons of honey with juice squeezed from half lemon. Place shaved pieces of ginger into 1/2-1 cup of water and warm over stove top. Then mix the ingredients and repeat every 4 hours as needed. Please take Tylenol 500mg-650mg every 6 hours for aches and pains, fevers. Hydrate very well with at least 2 liters of water. Eat light meals such as soups to replenish electrolytes and soft fruits, veggies. Start an antihistamine like Zyrtec (10mg daily) for postnasal drainage, sinus congestion.  You can take this together with pseudoephedrine (Sudafed) at a dose of 60 mg 2-3 times a day as needed for the same kind of congestion.  Use the cough medications as needed.   

## 2022-07-09 NOTE — ED Provider Notes (Signed)
[facility]  Note:  This document was prepared using Systems analyst and may include unintentional dictation errors.  MRN: [id number] DOB: [birth year]  Subjective:   [name] [name] is a 57 y.o. male presenting for 3-day history of acute onset fever, chills, body pains, sinus congestion and chest congestion, coughing.  Patient reports that today he feels much better but the previous 2 days he had significant symptoms.  Would like to be checked for COVID just to make sure.  No history of asthma.  Has previously had pleurisy.  No smoking.  No current facility-administered medications for this encounter.  Current Outpatient Medications:    calcium carbonate (TUMS EX) 750 MG chewable tablet, Chew 1-2 tablets by mouth as needed for heartburn., Disp: , Rfl:    famotidine (PEPCID) 20 MG tablet, Take 20 mg by mouth as needed for heartburn or indigestion., Disp: , Rfl:    ibuprofen (ADVIL) 200 MG tablet, Take 1-2 tablets by mouth as needed., Disp: , Rfl:    losartan (COZAAR) 50 MG tablet, Take 1/2 tablet by mouth twice a day (Patient taking differently: Take 50 mg by mouth daily.), Disp: 90 tablet, Rfl: 4   nitroGLYCERIN (NITROSTAT) 0.4 MG SL tablet, Place 1 tablet (0.4 mg total) under the tongue every 5 (five) minutes as needed for chest pain. (Patient not taking: Reported on 09/10/2021), Disp: 25 tablet, Rfl: 0   simethicone (MYLICON) 80 MG chewable tablet, Chew 80-160 mg by mouth as needed for flatulence., Disp: , Rfl:    valsartan (DIOVAN) 80 MG tablet, Take 1 tablet by mouth once a day, Disp: 90 tablet, Rfl: 4   Allergies  Allergen Reactions   Haldol [Haloperidol]     Other reaction(s): confused   Lactose    Latex     Past Medical History:  Diagnosis Date   Diverticulosis    Hyperlipidemia    Hypertension    Liver abscess      Past Surgical History:  Procedure Laterality Date   ABSCESS DRAIN LIVER PERC ([facility] HX)     LEFT HEART CATH AND  CORONARY ANGIOGRAPHY N/A 09/05/2019   Procedure: LEFT HEART CATH AND CORONARY ANGIOGRAPHY;  Surgeon: Leonie Man, MD;  Location: [facility];  Service: Cardiovascular;  Laterality: N/A;    Family History  Problem Relation Age of Onset   Other Mother        blood disorder   Diabetes Mellitus II Maternal Grandmother    Colon cancer Neg Hx    Esophageal cancer Neg Hx    Rectal cancer Neg Hx    Stomach cancer Neg Hx     Social History   Tobacco Use   Smoking status: Never   Smokeless tobacco: Never  Vaping Use   Vaping Use: Never used  Substance Use Topics   Alcohol use: No   Drug use: No    ROS   Objective:   Vitals: BP 135/88 (BP Location: Left Arm)   Pulse 83   Temp 97.9 F (36.6 C) (Oral)   Resp 18   SpO2 97%   Physical Exam Constitutional:      General: He is not in acute distress.    Appearance: Normal appearance. He is well-developed and normal weight. He is not ill-appearing, toxic-appearing or diaphoretic.  HENT:     Head: Normocephalic and atraumatic.     Right Ear: Tympanic membrane, ear canal and external ear normal. No drainage, swelling or tenderness. No middle ear effusion.  There is no impacted cerumen. Tympanic membrane is not erythematous or bulging.     Left Ear: Tympanic membrane, ear canal and external ear normal. No drainage, swelling or tenderness.  No middle ear effusion. There is no impacted cerumen. Tympanic membrane is not erythematous or bulging.     Nose: Congestion present. No rhinorrhea.     Mouth/Throat:     Mouth: Mucous membranes are moist.     Pharynx: No oropharyngeal exudate or posterior oropharyngeal erythema.     Comments: Thick postnasal drainage overlying pharynx. Eyes:     General: No scleral icterus.       Right eye: No discharge.        Left eye: No discharge.     Extraocular Movements: Extraocular movements intact.     Conjunctiva/sclera: Conjunctivae normal.  Cardiovascular:     Rate and Rhythm: Normal  rate and regular rhythm.     Heart sounds: Normal heart sounds. No murmur heard.    No friction rub. No gallop.  Pulmonary:     Effort: Pulmonary effort is normal. No respiratory distress.     Breath sounds: Normal breath sounds. No stridor. No wheezing, rhonchi or rales.  Musculoskeletal:     Cervical back: Normal range of motion and neck supple. No rigidity. No muscular tenderness.  Neurological:     General: No focal deficit present.     Mental Status: He is alert and oriented to person, place, and time.  Psychiatric:        Mood and Affect: Mood normal.        Behavior: Behavior normal.        Thought Content: Thought content normal.      Assessment and Plan :   PDMP not reviewed this encounter.  1. Acute viral syndrome   2. Throat pain     Patient initially requested a strep test but ended up changing his mind. Deferred imaging given clear cardiopulmonary exam, hemodynamically stable vital signs. Will manage for viral illness such as viral URI, viral syndrome, viral rhinitis, COVID-19. Recommended supportive care. Offered scripts for symptomatic relief. Testing is pending. Counseled patient on potential for adverse effects with medications prescribed/recommended today, ER and return-to-clinic precautions discussed, patient verbalized understanding.   Patient should undergo Paxlovid treatment should he test positive for this.   Jaynee Eagles, PA-C 07/09/22 [id number]

## 2022-07-10 ENCOUNTER — Other Ambulatory Visit (HOSPITAL_BASED_OUTPATIENT_CLINIC_OR_DEPARTMENT_OTHER): Payer: Self-pay

## 2022-07-10 LAB — SARS CORONAVIRUS 2 (TAT 6-24 HRS): SARS Coronavirus 2: NEGATIVE

## 2022-07-20 ENCOUNTER — Other Ambulatory Visit (HOSPITAL_BASED_OUTPATIENT_CLINIC_OR_DEPARTMENT_OTHER): Payer: Self-pay

## 2022-07-24 ENCOUNTER — Other Ambulatory Visit (HOSPITAL_BASED_OUTPATIENT_CLINIC_OR_DEPARTMENT_OTHER): Payer: Self-pay

## 2022-08-14 ENCOUNTER — Other Ambulatory Visit (HOSPITAL_BASED_OUTPATIENT_CLINIC_OR_DEPARTMENT_OTHER): Payer: Self-pay

## 2022-10-12 ENCOUNTER — Other Ambulatory Visit (HOSPITAL_BASED_OUTPATIENT_CLINIC_OR_DEPARTMENT_OTHER): Payer: Self-pay

## 2022-10-12 DIAGNOSIS — R972 Elevated prostate specific antigen [PSA]: Secondary | ICD-10-CM | POA: Diagnosis not present

## 2022-10-12 DIAGNOSIS — I2510 Atherosclerotic heart disease of native coronary artery without angina pectoris: Secondary | ICD-10-CM | POA: Diagnosis not present

## 2022-10-12 DIAGNOSIS — I10 Essential (primary) hypertension: Secondary | ICD-10-CM | POA: Diagnosis not present

## 2022-10-12 DIAGNOSIS — Z125 Encounter for screening for malignant neoplasm of prostate: Secondary | ICD-10-CM | POA: Diagnosis not present

## 2022-10-12 DIAGNOSIS — Z0000 Encounter for general adult medical examination without abnormal findings: Secondary | ICD-10-CM | POA: Diagnosis not present

## 2022-10-12 DIAGNOSIS — Z79899 Other long term (current) drug therapy: Secondary | ICD-10-CM | POA: Diagnosis not present

## 2022-10-12 MED ORDER — ROSUVASTATIN CALCIUM 10 MG PO TABS
10.0000 mg | ORAL_TABLET | Freq: Every day | ORAL | 3 refills | Status: DC
  Filled 2022-10-12: qty 90, 90d supply, fill #0
  Filled 2023-02-03: qty 90, 90d supply, fill #1
  Filled 2023-05-06: qty 90, 90d supply, fill #2
  Filled 2023-08-16: qty 90, 90d supply, fill #3

## 2022-10-13 ENCOUNTER — Other Ambulatory Visit (HOSPITAL_BASED_OUTPATIENT_CLINIC_OR_DEPARTMENT_OTHER): Payer: Self-pay

## 2022-10-13 MED ORDER — VALSARTAN 80 MG PO TABS
80.0000 mg | ORAL_TABLET | Freq: Two times a day (BID) | ORAL | 3 refills | Status: DC
  Filled 2022-10-13: qty 180, 90d supply, fill #0
  Filled 2023-02-03: qty 180, 90d supply, fill #1
  Filled 2023-05-06: qty 180, 90d supply, fill #2
  Filled 2023-08-16: qty 180, 90d supply, fill #3

## 2022-10-14 ENCOUNTER — Other Ambulatory Visit (HOSPITAL_BASED_OUTPATIENT_CLINIC_OR_DEPARTMENT_OTHER): Payer: Self-pay

## 2022-10-14 MED ORDER — ROSUVASTATIN CALCIUM 5 MG PO TABS
5.0000 mg | ORAL_TABLET | Freq: Every day | ORAL | 3 refills | Status: AC
  Filled 2022-10-14: qty 90, 90d supply, fill #0

## 2022-10-27 ENCOUNTER — Other Ambulatory Visit (HOSPITAL_BASED_OUTPATIENT_CLINIC_OR_DEPARTMENT_OTHER): Payer: Self-pay

## 2023-10-22 DIAGNOSIS — R7303 Prediabetes: Secondary | ICD-10-CM | POA: Diagnosis not present

## 2023-10-22 DIAGNOSIS — I2510 Atherosclerotic heart disease of native coronary artery without angina pectoris: Secondary | ICD-10-CM | POA: Diagnosis not present

## 2023-10-22 DIAGNOSIS — Z79899 Other long term (current) drug therapy: Secondary | ICD-10-CM | POA: Diagnosis not present

## 2023-10-22 DIAGNOSIS — R972 Elevated prostate specific antigen [PSA]: Secondary | ICD-10-CM | POA: Diagnosis not present

## 2023-10-22 DIAGNOSIS — Z0000 Encounter for general adult medical examination without abnormal findings: Secondary | ICD-10-CM | POA: Diagnosis not present

## 2023-10-22 DIAGNOSIS — K279 Peptic ulcer, site unspecified, unspecified as acute or chronic, without hemorrhage or perforation: Secondary | ICD-10-CM | POA: Diagnosis not present

## 2023-11-12 ENCOUNTER — Other Ambulatory Visit (HOSPITAL_BASED_OUTPATIENT_CLINIC_OR_DEPARTMENT_OTHER): Payer: Self-pay

## 2023-11-12 MED ORDER — ROSUVASTATIN CALCIUM 10 MG PO TABS
10.0000 mg | ORAL_TABLET | Freq: Every day | ORAL | 3 refills | Status: AC
  Filled 2023-11-12: qty 90, 90d supply, fill #0
  Filled 2024-02-15: qty 90, 90d supply, fill #1
  Filled 2024-05-17: qty 90, 90d supply, fill #2

## 2023-11-12 MED ORDER — VALSARTAN 80 MG PO TABS
80.0000 mg | ORAL_TABLET | Freq: Two times a day (BID) | ORAL | 3 refills | Status: AC
  Filled 2023-11-12: qty 180, 90d supply, fill #0
  Filled 2024-02-15: qty 180, 90d supply, fill #1
  Filled 2024-05-17: qty 180, 90d supply, fill #2

## 2024-01-26 DIAGNOSIS — R972 Elevated prostate specific antigen [PSA]: Secondary | ICD-10-CM | POA: Diagnosis not present

## 2024-01-26 DIAGNOSIS — R310 Gross hematuria: Secondary | ICD-10-CM | POA: Diagnosis not present

## 2024-01-27 ENCOUNTER — Other Ambulatory Visit: Payer: Self-pay | Admitting: Urology

## 2024-01-27 DIAGNOSIS — R972 Elevated prostate specific antigen [PSA]: Secondary | ICD-10-CM

## 2024-02-11 ENCOUNTER — Ambulatory Visit
Admission: RE | Admit: 2024-02-11 | Discharge: 2024-02-11 | Disposition: A | Discharge status: Home / Self Care | Source: Ambulatory Visit | Attending: Urology | Admitting: Urology

## 2024-02-11 DIAGNOSIS — R972 Elevated prostate specific antigen [PSA]: Secondary | ICD-10-CM | POA: Diagnosis not present

## 2024-02-11 MED ORDER — GADOPICLENOL 0.5 MMOL/ML IV SOLN
10.0000 mL | Freq: Once | INTRAVENOUS | Status: AC | PRN
  Administered 2024-02-11: 8 mL via INTRAVENOUS

## 2024-02-15 ENCOUNTER — Other Ambulatory Visit (HOSPITAL_BASED_OUTPATIENT_CLINIC_OR_DEPARTMENT_OTHER): Payer: Self-pay

## 2024-05-17 ENCOUNTER — Other Ambulatory Visit (HOSPITAL_BASED_OUTPATIENT_CLINIC_OR_DEPARTMENT_OTHER): Payer: Self-pay

## 2024-05-18 ENCOUNTER — Other Ambulatory Visit (HOSPITAL_BASED_OUTPATIENT_CLINIC_OR_DEPARTMENT_OTHER): Payer: Self-pay
